# Patient Record
Sex: Male | Born: 1965
Health system: Southern US, Community
[De-identification: ages and names within clinical notes are randomized; demographics above are authoritative.]

## PROBLEM LIST (undated history)

## (undated) DIAGNOSIS — T7840XA Allergy, unspecified, initial encounter: Secondary | ICD-10-CM

## (undated) DIAGNOSIS — E785 Hyperlipidemia, unspecified: Secondary | ICD-10-CM

## (undated) DIAGNOSIS — K5790 Diverticulosis of intestine, part unspecified, without perforation or abscess without bleeding: Secondary | ICD-10-CM

## (undated) HISTORY — PX: TOOTH EXTRACTION: SUR596

## (undated) HISTORY — DX: Diverticulosis of intestine, part unspecified, without perforation or abscess without bleeding: K57.90

## (undated) HISTORY — DX: Hyperlipidemia, unspecified: E78.5

## (undated) HISTORY — PX: WISDOM TOOTH EXTRACTION: SHX21

## (undated) HISTORY — DX: Allergy, unspecified, initial encounter: T78.40XA

---

## 2003-01-24 ENCOUNTER — Encounter: Admission: RE | Admit: 2003-01-24 | Discharge: 2003-01-24 | Payer: Self-pay | Admitting: Family Medicine

## 2003-01-24 ENCOUNTER — Encounter: Payer: Self-pay | Admitting: Family Medicine

## 2004-07-22 ENCOUNTER — Encounter: Admission: RE | Admit: 2004-07-22 | Discharge: 2004-07-22 | Payer: Self-pay | Admitting: Family Medicine

## 2004-08-31 ENCOUNTER — Encounter: Admission: RE | Admit: 2004-08-31 | Discharge: 2004-08-31 | Payer: Self-pay | Admitting: Family Medicine

## 2004-09-02 ENCOUNTER — Encounter: Admission: RE | Admit: 2004-09-02 | Discharge: 2004-09-02 | Payer: Self-pay | Admitting: Family Medicine

## 2014-08-12 LAB — LIPID PANEL
Cholesterol: 193 (ref 0–200)
HDL: 43 (ref 35–70)
Triglycerides: 114 (ref 40–160)

## 2014-08-12 LAB — BASIC METABOLIC PANEL: GLUCOSE: 87

## 2014-12-01 ENCOUNTER — Encounter (HOSPITAL_COMMUNITY): Payer: Self-pay | Admitting: *Deleted

## 2014-12-01 ENCOUNTER — Emergency Department (HOSPITAL_COMMUNITY)
Admission: EM | Admit: 2014-12-01 | Discharge: 2014-12-01 | Disposition: A | Discharge status: Home / Self Care | Payer: 59 | Attending: Emergency Medicine | Admitting: Emergency Medicine

## 2014-12-01 ENCOUNTER — Emergency Department (HOSPITAL_COMMUNITY): Payer: 59

## 2014-12-01 DIAGNOSIS — K088 Other specified disorders of teeth and supporting structures: Secondary | ICD-10-CM | POA: Insufficient documentation

## 2014-12-01 DIAGNOSIS — K122 Cellulitis and abscess of mouth: Secondary | ICD-10-CM

## 2014-12-01 DIAGNOSIS — K0889 Other specified disorders of teeth and supporting structures: Secondary | ICD-10-CM

## 2014-12-01 DIAGNOSIS — R51 Headache: Secondary | ICD-10-CM | POA: Insufficient documentation

## 2014-12-01 DIAGNOSIS — R079 Chest pain, unspecified: Secondary | ICD-10-CM | POA: Insufficient documentation

## 2014-12-01 LAB — CBC WITH DIFFERENTIAL/PLATELET
Basophils Absolute: 0 10*3/uL (ref 0.0–0.1)
Basophils Relative: 0 % (ref 0–1)
Eosinophils Absolute: 0 10*3/uL (ref 0.0–0.7)
Eosinophils Relative: 0 % (ref 0–5)
HEMATOCRIT: 45.8 % (ref 39.0–52.0)
Hemoglobin: 16.3 g/dL (ref 13.0–17.0)
LYMPHS PCT: 8 % — AB (ref 12–46)
Lymphs Abs: 0.6 10*3/uL — ABNORMAL LOW (ref 0.7–4.0)
MCH: 32 pg (ref 26.0–34.0)
MCHC: 35.6 g/dL (ref 30.0–36.0)
MCV: 90 fL (ref 78.0–100.0)
MONO ABS: 0.6 10*3/uL (ref 0.1–1.0)
Monocytes Relative: 8 % (ref 3–12)
Neutro Abs: 6.1 10*3/uL (ref 1.7–7.7)
Neutrophils Relative %: 84 % — ABNORMAL HIGH (ref 43–77)
PLATELETS: 129 10*3/uL — AB (ref 150–400)
RBC: 5.09 MIL/uL (ref 4.22–5.81)
RDW: 12.5 % (ref 11.5–15.5)
WBC: 7.3 10*3/uL (ref 4.0–10.5)

## 2014-12-01 LAB — COMPREHENSIVE METABOLIC PANEL
ALK PHOS: 68 U/L (ref 38–126)
ALT: 28 U/L (ref 17–63)
AST: 24 U/L (ref 15–41)
Albumin: 4.2 g/dL (ref 3.5–5.0)
Anion gap: 9 (ref 5–15)
BILIRUBIN TOTAL: 1.7 mg/dL — AB (ref 0.3–1.2)
BUN: 14 mg/dL (ref 6–20)
CALCIUM: 9.2 mg/dL (ref 8.9–10.3)
CHLORIDE: 106 mmol/L (ref 101–111)
CO2: 24 mmol/L (ref 22–32)
Creatinine, Ser: 1.12 mg/dL (ref 0.61–1.24)
GFR calc non Af Amer: >60 mL/min (ref >60)
Glucose, Bld: 110 mg/dL — ABNORMAL HIGH (ref 65–99)
Potassium: 4 mmol/L (ref 3.5–5.1)
SODIUM: 139 mmol/L (ref 135–145)
TOTAL PROTEIN: 6.9 g/dL (ref 6.5–8.1)

## 2014-12-01 MED ORDER — OXYCODONE-ACETAMINOPHEN 5-325 MG PO TABS: 1.0000 | ORAL_TABLET | Freq: Three times a day (TID) | ORAL | Status: DC | PRN

## 2014-12-01 MED ORDER — ONDANSETRON HCL 4 MG/2ML IJ SOLN
4.0000 mg | Freq: Once | INTRAMUSCULAR | Status: AC
  Administered 2014-12-01: 4 mg via INTRAVENOUS
  Filled 2014-12-01: qty 2

## 2014-12-01 MED ORDER — IOHEXOL 300 MG/ML  SOLN
80.0000 mL | Freq: Once | INTRAMUSCULAR | Status: AC | PRN
  Administered 2014-12-01: 100 mL via INTRAVENOUS

## 2014-12-01 MED ORDER — CLINDAMYCIN HCL 300 MG PO CAPS
450.0000 mg | ORAL_CAPSULE | Freq: Once | ORAL | Status: AC
  Administered 2014-12-01: 450 mg via ORAL
  Filled 2014-12-01: qty 1

## 2014-12-01 MED ORDER — SODIUM CHLORIDE 0.9 % IV BOLUS (SEPSIS)
1000.0000 mL | Freq: Once | INTRAVENOUS | Status: AC
Start: 2014-12-01 — End: 2014-12-01
  Administered 2014-12-01: 1000 mL via INTRAVENOUS

## 2014-12-01 MED ORDER — MORPHINE SULFATE 2 MG/ML IJ SOLN
2.0000 mg | Freq: Once | INTRAMUSCULAR | Status: AC
  Administered 2014-12-01: 2 mg via INTRAVENOUS
  Filled 2014-12-01: qty 1

## 2014-12-01 MED ORDER — CLINDAMYCIN HCL 150 MG PO CAPS: 450.0000 mg | ORAL_CAPSULE | Freq: Three times a day (TID) | ORAL | Status: DC

## 2014-12-01 NOTE — ED Notes (Signed)
PA at bedside.

## 2014-12-01 NOTE — ED Notes (Signed)
Pt states that he has a known cracked tooth. Pt has a scheduled appointment with his dentist but has been running a fever at home and is not having difficulty opening his mouth. Pt was told by his dentist to come in for possible IV antibiotic. Pt has been taking Ibuprophen but no oral antibiotics

## 2014-12-01 NOTE — Discharge Instructions (Signed)
Please call your doctor for a followup appointment within 24-48 hours. When you talk to your doctor please let them know that you were seen in the emergency department and have them acquire all of your records so that they can discuss the findings with you and formulate a treatment plan to fully care for your new and ongoing problems. Please follow-up with her primary care provider Please keep appointment with your dentist on 12/03/2014 Please take anti-biotics as prescribed Please massage and apply warm compressions to the area Please take medications as prescribed - while on pain medications there is to be no drinking alcohol, driving, operating any heavy machinery. If extra please dispose in a proper manner. Please do not take any extra Tylenol with this medication for this can lead to Tylenol overdose and liver issues.  Please continue to monitor symptoms closely and if symptoms are to worsen or change (fever greater than 101, chills, sweating, nausea, vomiting, chest pain, shortness of breathe, difficulty breathing, weakness, numbness, tingling, worsening or changes to pain pattern, tongue swelling, throat closing sensation, difficulty breathing, increased swelling to the face or the neck, changes to skin colored, active bleeding or drainage at the site) please report back to the Emergency Department immediately.    Cellulitis Cellulitis is an infection of the skin and the tissue beneath it. The infected area is usually red and tender. Cellulitis occurs most often in the arms and lower legs.  CAUSES  Cellulitis is caused by bacteria that enter the skin through cracks or cuts in the skin. The most common types of bacteria that cause cellulitis are staphylococci and streptococci. SIGNS AND SYMPTOMS   Redness and warmth.  Swelling.  Tenderness or pain.  Fever. DIAGNOSIS  Your health care provider can usually determine what is wrong based on a physical exam. Blood tests may also be  done. TREATMENT  Treatment usually involves taking an antibiotic medicine. HOME CARE INSTRUCTIONS   Take your antibiotic medicine as directed by your health care provider. Finish the antibiotic even if you start to feel better.  Keep the infected arm or leg elevated to reduce swelling.  Apply a warm cloth to the affected area up to 4 times per day to relieve pain.  Take medicines only as directed by your health care provider.  Keep all follow-up visits as directed by your health care provider. SEEK MEDICAL CARE IF:   You notice red streaks coming from the infected area.  Your red area gets larger or turns dark in color.  Your bone or joint underneath the infected area becomes painful after the skin has healed.  Your infection returns in the same area or another area.  You notice a swollen bump in the infected area.  You develop new symptoms.  You have a fever. SEEK IMMEDIATE MEDICAL CARE IF:   You feel very sleepy.  You develop vomiting or diarrhea.  You have a general ill feeling (malaise) with muscle aches and pains. MAKE SURE YOU:   Understand these instructions.  Will watch your condition.  Will get help right away if you are not doing well or get worse. Document Released: 03/24/2005 Document Revised: 10/29/2013 Document Reviewed: 08/30/2011 [facility] Patient Information 2015 [place], [place]. This information is not intended to replace advice given to you by your health care provider. Make sure you discuss any questions you have with your health care provider.

## 2014-12-01 NOTE — ED Provider Notes (Addendum)
Patient complains of fractured painful tooth occurred 3 weeks ago. Saw his dentist last week. Is scheduled to have crown placed in 2 days. Admits to subjective fever last night with chills. Admits to pain with swallowing. No dyspnea. He reports chest pain left-sided lateral chest 8 separate episodes lasting a split-second each. No shortness of breath no other associated symptoms.  Date: 12/01/2014  Rate: 75  Rhythm: normal sinus rhythm  QRS Axis: normal  Intervals: normal  ST/T Wave abnormalities: normal  Conduction Disutrbances: none  Narrative Interpretation: unremarkable Patient is no distress handling secretions well. Tooth #32 is tender. No swelling around the gingiva. No trismus.    Orlie Dakin, MD 12/01/14 1116 Chest pain highly atypical for acs  Orlie Dakin, MD 12/01/14 [phone]

## 2014-12-01 NOTE — ED Provider Notes (Signed)
CSN: [id number]     Arrival date & time 12/01/14  0907 History   None    Chief Complaint  Patient presents with  . Dental Pain     (Consider location/radiation/quality/duration/timing/severity/associated sxs/prior Treatment) The history is provided by the patient. No language interpreter was used.  Randy Mendez is a 49 y/o M with no known significant PMHx presenting to the ED with dental pain that started 4 days ago with worsening last night. Patient reported that he was seen and assessed by his dentist, Dr. Merrilee Jansky, 5 days ago where he was diagnosed with a cracked tooth and due to get a crown the following week. Patient reported that he has noticed significant swelling and pain over the past couple of days. Reported that last night he was diaphoretic with fever and stated that he was unable to sleep secondary to the pain - reported his fever to be 99.65F. Stated that the pain is localized to the right lower jawline described as a constant pain. Reported that he has been unable to open and close his jawline secondary to the pain. Stated that he also started to experience cramping in his neck and mouth last night. Patient stated that he has been taking Ibuprofen continuously throughout the day. Patient reported that a friend of his is a Pharmacist, community and this individual recommended patient to come to the ED to get IV antibiotics. Patient reported that he is concerned that he will "die from a tooth infection." Reported that while sitting in the waiting room of the ED he started to experience left sided chest pain described as sharp intermittent discomfort without radiation. Stated that he had a headache yesterday - denied thunderclap onset and worst headache of life. Denied bleeding, pus drainage, blurred vision, sudden loss of vision, chest pain, shortness of breath, difficulty breathing, difficulty swallowing, changes to skin color, cough, hemoptysis, throat closing sensation, vomiting, abdominal pain,  hematochezia, melena.  PCP none    History reviewed. No pertinent past medical history. History reviewed. No pertinent past surgical history. No family history on file. History  Substance Use Topics  . Smoking status: Never Smoker   . Smokeless tobacco: Not on file  . Alcohol Use: Not on file    Review of Systems  Constitutional: Positive for fever and fatigue. Negative for chills.  HENT: Positive for dental problem. Negative for trouble swallowing.   Eyes: Negative for visual disturbance.  Respiratory: Negative for cough, chest tightness and shortness of breath.   Cardiovascular: Positive for chest pain.  Gastrointestinal: Positive for nausea. Negative for vomiting and abdominal pain.  Neurological: Positive for headaches. Negative for dizziness and weakness.      Allergies  Review of patient's allergies indicates no known allergies.  Home Medications   Prior to Admission medications   Medication Sig Start Date End Date Taking? Authorizing Provider  ibuprofen (ADVIL,MOTRIN) 200 MG tablet Take 200 mg by mouth every 6 (six) hours as needed for mild pain.   Yes Historical Provider, MD  promethazine (PHENERGAN) 25 MG tablet Take 25 mg by mouth every 6 (six) hours as needed for nausea or vomiting.   Yes Historical Provider, MD  clindamycin (CLEOCIN) 150 MG capsule Take 3 capsules (450 mg total) by mouth 3 (three) times daily. 12/01/14   Nico Syme, PA-C  oxyCODONE-acetaminophen (PERCOCET/ROXICET) 5-325 MG per tablet Take 1 tablet by mouth every 8 (eight) hours as needed for severe pain. 12/01/14   Sorrel Cassetta, PA-C   BP 115/74 mmHg  Pulse  60  Temp(Src) 99.8 F (37.7 C) (Rectal)  Resp 16  SpO2 100% Physical Exam  Constitutional: He is oriented to person, place, and time. He appears well-developed and well-nourished. No distress.  HENT:  Head: Normocephalic and atraumatic.  Mouth/Throat: There is trismus in the jaw.    Facial swelling identified to the right  submandibular region with discomfort upon palpation and negative changes to skin color. Tenderness upon palpation to the right submandibular region. Negative damage noted to teeth-negative diagrammed in decaying noted. Positive trismus-unable to see the posterior aspect of the mouth. Negative tongue swelling. Negative sublingual lesions or tenderness upon palpation to the floor of the mouth.  Eyes: Conjunctivae and EOM are normal. Pupils are equal, round, and reactive to light. Right eye exhibits no discharge. Left eye exhibits no discharge.  Neck: Normal range of motion. Neck supple.  Cardiovascular: Normal rate, regular rhythm and normal heart sounds.  Exam reveals no friction rub.   No murmur heard. Pulses:      Radial pulses are 2+ on the right side, and 2+ on the left side.  Pulmonary/Chest: Effort normal and breath sounds normal. No respiratory distress. He has no wheezes. He has no rales.  Musculoskeletal: Normal range of motion.  Lymphadenopathy:       Head (right side): Submandibular and tonsillar adenopathy present.  Neurological: He is alert and oriented to person, place, and time. No cranial nerve deficit. He exhibits normal muscle tone. Coordination normal. GCS eye subscore is 4. GCS verbal subscore is 5. GCS motor subscore is 6.  Skin: Skin is warm and dry. No rash noted. He is not diaphoretic. No erythema.  Psychiatric: He has a normal mood and affect. His behavior is normal. Thought content normal.  Nursing note and vitals reviewed.   ED Course  Procedures (including critical care time) Labs Review Labs Reviewed  CBC WITH DIFFERENTIAL/PLATELET - Abnormal; Notable for the following:    Platelets 129 (*)    Neutrophils Relative % 84 (*)    Lymphocytes Relative 8 (*)    Lymphs Abs 0.6 (*)    All other components within normal limits  COMPREHENSIVE METABOLIC PANEL - Abnormal; Notable for the following:    Glucose, Bld 110 (*)    Total Bilirubin 1.7 (*)    All other  components within normal limits    Imaging Review Ct Soft Tissue Neck W Contrast  12/01/2014   CLINICAL DATA:  49 year old male with cracked tooth. Right neck pain. Initial encounter.  EXAM: CT NECK WITH CONTRAST  TECHNIQUE: Multidetector CT imaging of the neck was performed using the standard protocol following the bolus administration of intravenous contrast.  CONTRAST:  138mL OMNIPAQUE IOHEXOL 300 MG/ML  SOLN  COMPARISON:  None.  FINDINGS: Pharynx and larynx: No primary worrisome abnormality.  Salivary glands: Asymmetric positioning of submandibular glands with right lower than the left. Minimal prominence proximal right submandibular ducts without stone identified. Surrounding infiltration of fat planes. Please see below.  Thyroid: Negative.  Lymph nodes: Increased number of lymph nodes right neck.  Vascular: No septic thrombophlebitis detected.  Limited intracranial: Negative.  Visualized orbits: Negative.  Mastoids and visualized paranasal sinuses: Clear.  Skeleton: Cervical spondylotic changes with various degrees of spinal stenosis and foraminal narrowing C5-6 and C6-7.  Upper chest: Negative.  Haziness of fat planes greatest surrounding the right submandibular gland with poor definition of fat within the lower right parapharyngeal space suggestive of cellulitis without deep drainable abscess. This may originate from the patient's right lower  posterior molar which is fractured per history. Although the right submandibular gland ducts appear slightly prominent no calcified submandibular duct stone identified as a source of inflammation. Surrounding increased number of right-sided lymph nodes most likely reactive in origin.  IMPRESSION: Haziness of fat planes greatest surrounding the right submandibular gland with poor definition of fat within the lower right parapharyngeal space suggestive of cellulitis currently without deep drainable abscess. This may originate from the patient's right lower posterior  molar which is fractured per history.  Although the right submandibular gland ducts appear slightly prominent no calcified submandibular duct stone identified as a source of inflammation.  Surrounding increased number of right-sided lymph nodes most likely reactive in origin.   Electronically Signed   By: Genia Del M.D.   On: 12/01/2014 12:46     EKG Interpretation None       11:13 AM Dr. Winfred Leeds at bedside assessing patient. Reported that Troponin is not needed. EKG unremarkable.   1:46 PM This provider re-assessed patient. Patient reported that he is feeling well. Discussed labs and imaging in great detail. Discussed plan for discharge with patient who is in accordance to plan of care. Patient breathing well in no sign of distress.   MDM   Final diagnoses:  Pain, dental  Cellulitis of mouth    Medications  clindamycin (CLEOCIN) capsule 450 mg (not administered)  sodium chloride 0.9 % bolus 1,000 mL (0 mLs Intravenous Stopped 12/01/14 1200)  morphine 2 MG/ML injection 2 mg (2 mg Intravenous Given 12/01/14 1103)  ondansetron (ZOFRAN) injection 4 mg (4 mg Intravenous Given 12/01/14 1102)  iohexol (OMNIPAQUE) 300 MG/ML solution 80 mL (100 mLs Intravenous Contrast Given 12/01/14 1128)    Filed Vitals:   12/01/14 1200 12/01/14 1245 12/01/14 1315 12/01/14 1353  BP: 122/74 107/66 109/64 115/74  Pulse: 62 63 66 60  Temp:      TempSrc:      Resp:   24 16  SpO2: 97% 95% 95% 100%   Patient presenting to the ED with dental pain that started approximate 4 days ago after being seen by his dentist and diagnosed with a diagrammed tooth that was getting a crown placed this week. Patient reports that the pain worsened last night with associated fever and chills. Patient reported increased swelling and cramping to the neck into the jawline-reported that he is unable to open and close his jaw. Patient was not placed on antibiotics.  Rectal temperature 99.13F. EKG noted normal sinus rhythm with a  heart rate of 75 bpm. CBC negative elevated leukocytosis. Hemoglobin 16.3, hematocrit 45.8. CMP unremarkable. CT soft tissue of neck with contrast noted haziness of the fat planes suggesting cellulitis without deep drainable abscess noted to the right second mandibular gland. No stone identified in the submandibular duct surrounding increased number of right-sided lymph nodes are most likely reactive in origin. CT soft tissue neck with contrast identified cellulitis in the right submandibular region without drainable abscess-associated with fractured tooth noted on examination. Negative findings of peritonsillar abscess. Negative findings of Ludwig's angina. Negative findings of retroperitoneal abscess. Patient given IV fluids, pain medications and anti-biotics in ED setting. Patient stable, afebrile. Patient not septic appearing. Airway intact, negative signs of respiratory distress. Discharged patient. Discharge patient with anti-biotics and pain medications-discussed course, cautions, disposal technique. Referred patient to primary care provider and dentist. Discussed with patient to keep appointment on Tuesday, 12/03/2014 with dentist. Discussed with patient to rest and stay hydrated. Discussed with patient to apply warm compressions. Discussed  with patient to closely monitor symptoms and if symptoms are to worsen or change to report back to the ED - strict return instructions given.  Patient agreed to plan of care, understood, all questions answered.   Jamse Mead, PA-C 12/01/14 1421  Orlie Dakin, MD 12/01/14 [phone]

## 2015-09-24 LAB — LIPID PANEL
CHOLESTEROL: 203 — AB (ref 0–200)
HDL: 40 (ref 35–70)
TRIGLYCERIDES: 174 — AB (ref 40–160)

## 2015-09-26 LAB — BASIC METABOLIC PANEL: GLUCOSE: 82

## 2015-09-26 LAB — PSA: PSA: 0.86

## 2017-07-11 ENCOUNTER — Encounter: Payer: Self-pay | Admitting: Family Medicine

## 2017-07-11 ENCOUNTER — Encounter: Payer: Self-pay | Admitting: Gastroenterology

## 2017-07-11 ENCOUNTER — Ambulatory Visit (INDEPENDENT_AMBULATORY_CARE_PROVIDER_SITE_OTHER): Payer: 59 | Admitting: Family Medicine

## 2017-07-11 VITALS — BP 102/70 | HR 70 | Temp 98.6°F | Ht 68.75 in | Wt 183.7 lb

## 2017-07-11 DIAGNOSIS — R1032 Left lower quadrant pain: Secondary | ICD-10-CM | POA: Diagnosis not present

## 2017-07-11 DIAGNOSIS — G43909 Migraine, unspecified, not intractable, without status migrainosus: Secondary | ICD-10-CM | POA: Insufficient documentation

## 2017-07-11 DIAGNOSIS — Z1211 Encounter for screening for malignant neoplasm of colon: Secondary | ICD-10-CM

## 2017-07-11 DIAGNOSIS — Z7689 Persons encountering health services in other specified circumstances: Secondary | ICD-10-CM

## 2017-07-11 DIAGNOSIS — J302 Other seasonal allergic rhinitis: Secondary | ICD-10-CM | POA: Insufficient documentation

## 2017-07-11 LAB — CBC WITH DIFFERENTIAL/PLATELET
Basophils Absolute: 0 10*3/uL (ref 0.0–0.1)
Basophils Relative: 0.5 % (ref 0.0–3.0)
EOS PCT: 0.6 % (ref 0.0–5.0)
Eosinophils Absolute: 0.1 10*3/uL (ref 0.0–0.7)
HCT: 45.1 % (ref 39.0–52.0)
HEMOGLOBIN: 15.4 g/dL (ref 13.0–17.0)
LYMPHS PCT: 20.7 % (ref 12.0–46.0)
Lymphs Abs: 1.8 10*3/uL (ref 0.7–4.0)
MCHC: 34.2 g/dL (ref 30.0–36.0)
MCV: 94.1 fl (ref 78.0–100.0)
MONO ABS: 0.5 10*3/uL (ref 0.1–1.0)
MONOS PCT: 5.5 % (ref 3.0–12.0)
Neutro Abs: 6.4 10*3/uL (ref 1.4–7.7)
Neutrophils Relative %: 72.7 % (ref 43.0–77.0)
Platelets: 204 10*3/uL (ref 150.0–400.0)
RBC: 4.79 Mil/uL (ref 4.22–5.81)
RDW: 13.4 % (ref 11.5–15.5)
WBC: 8.8 10*3/uL (ref 4.0–10.5)

## 2017-07-11 LAB — COMPREHENSIVE METABOLIC PANEL
ALBUMIN: 4.7 g/dL (ref 3.5–5.2)
ALK PHOS: 60 U/L (ref 39–117)
ALT: 18 U/L (ref 0–53)
AST: 17 U/L (ref 0–37)
BUN: 12 mg/dL (ref 6–23)
CO2: 33 mEq/L — ABNORMAL HIGH (ref 19–32)
Calcium: 9.2 mg/dL (ref 8.4–10.5)
Chloride: 100 mEq/L (ref 96–112)
Creatinine, Ser: 1.1 mg/dL (ref 0.40–1.50)
GFR: 74.96 mL/min (ref >60.00)
Glucose, Bld: 72 mg/dL (ref 70–99)
POTASSIUM: 3.8 meq/L (ref 3.5–5.1)
Sodium: 138 mEq/L (ref 135–145)
TOTAL PROTEIN: 6.9 g/dL (ref 6.0–8.3)
Total Bilirubin: 0.9 mg/dL (ref 0.2–1.2)

## 2017-07-11 NOTE — Patient Instructions (Addendum)
Abdominal Pain, Adult Many things can cause belly (abdominal) pain. Most times, belly pain is not dangerous. Many cases of belly pain can be watched and treated at home. Sometimes belly pain is serious, though. Your doctor will try to find the cause of your belly pain. Follow these instructions at home:  Take over-the-counter and prescription medicines only as told by your doctor. Do not take medicines that help you poop (laxatives) unless told to by your doctor.  Drink enough fluid to keep your pee (urine) clear or pale yellow.  Watch your belly pain for any changes.  Keep all follow-up visits as told by your doctor. This is important. Contact a doctor if:  Your belly pain changes or gets worse.  You are not hungry, or you lose weight without trying.  You are having trouble pooping (constipated) or have watery poop (diarrhea) for more than 2-3 days.  You have pain when you pee or poop.  Your belly pain wakes you up at night.  Your pain gets worse with meals, after eating, or with certain foods.  You are throwing up and cannot keep anything down.  You have a fever. Get help right away if:  Your pain does not go away as soon as your doctor says it should.  You cannot stop throwing up.  Your pain is only in areas of your belly, such as the right side or the left lower part of the belly.  You have bloody or black poop, or poop that looks like tar.  You have very bad pain, cramping, or bloating in your belly.  You have signs of not having enough fluid or water in your body (dehydration), such as: ? Dark pee, very little pee, or no pee. ? Cracked lips. ? Dry mouth. ? Sunken eyes. ? Sleepiness. ? Weakness. This information is not intended to replace advice given to you by your health care provider. Make sure you discuss any questions you have with your health care provider. Document Released: 12/01/2007 Document Revised: 01/02/2016 Document Reviewed: 11/26/2015 Elsevier  Interactive Patient Education  2018 [employer].  Colonoscopy, Adult A colonoscopy is an exam to look at the large intestine. It is done to check for problems, such as:  Lumps (tumors).  Growths (polyps).  Swelling (inflammation).  Bleeding.  What happens before the procedure? Eating and drinking Follow instructions from your doctor about eating and drinking. These instructions may include:  A few days before the procedure - follow a low-fiber diet. ? Avoid nuts. ? Avoid seeds. ? Avoid dried fruit. ? Avoid raw fruits. ? Avoid vegetables.  1-3 days before the procedure - follow a clear liquid diet. Avoid liquids that have red or purple dye. Drink only clear liquids, such as: ? Clear broth or bouillon. ? Black coffee or tea. ? Clear juice. ? Clear soft drinks or sports drinks. ? Gelatin dessert. ? Popsicles.  On the day of the procedure - do not eat or drink anything during the 2 hours before the procedure.  Bowel prep If you were prescribed an oral bowel prep:  Take it as told by your doctor. Starting the day before your procedure, you will need to drink a lot of liquid. The liquid will cause you to poop (have bowel movements) until your poop is almost clear or light green.  If your skin or butt gets irritated from diarrhea, you may: ? Wipe the area with wipes that have medicine in them, such as adult wet wipes with aloe and  vitamin E. ? Put something on your skin that soothes the area, such as petroleum jelly.  If you throw up (vomit) while drinking the bowel prep, take a break for up to 60 minutes. Then begin the bowel prep again. If you keep throwing up and you cannot take the bowel prep without throwing up, call your doctor.  General instructions  Ask your doctor about changing or stopping your normal medicines. This is important if you take diabetes medicines or blood thinners.  Plan to have someone take you home from the hospital or clinic. What happens during  the procedure?  An IV tube may be put into one of your veins.  You will be given medicine to help you relax (sedative).  To reduce your risk of infection: ? Your doctors will wash their hands. ? Your anal area will be washed with soap.  You will be asked to lie on your side with your knees bent.  Your doctor will get a long, thin, flexible tube ready. The tube will have a camera and a light on the end.  The tube will be put into your anus.  The tube will be gently put into your large intestine.  Air will be delivered into your large intestine to keep it open. You may feel some pressure or cramping.  The camera will be used to take photos.  A small tissue sample may be removed from your body to be looked at under a microscope (biopsy). If any possible problems are found, the tissue will be sent to a lab for testing.  If small growths are found, your doctor may remove them and have them checked for cancer.  The tube that was put into your anus will be slowly removed. The procedure may vary among doctors and hospitals. What happens after the procedure?  Your doctor will check on you often until the medicines you were given have worn off.  Do not drive for 24 hours after the procedure.  You may have a small amount of blood in your poop.  You may pass gas.  You may have mild cramps or bloating in your belly (abdomen).  It is up to you to get the results of your procedure. Ask your doctor, or the department performing the procedure, when your results will be ready. This information is not intended to replace advice given to you by your health care provider. Make sure you discuss any questions you have with your health care provider. Document Released: 07/17/2010 Document Revised: 04/14/2016 Document Reviewed: 08/26/2015 Elsevier Interactive Patient Education  2017 [employer].

## 2017-07-11 NOTE — Progress Notes (Signed)
Patient presents to clinic today to establish care.  SUBJECTIVE: PMH:  Pt is a 52 yo with pmh sig for seasonal allergies and migraines.  Pt was formerly seen at [facility], but decided to leave over billing issues and never being able to see the same provider.  L Groin pain, acute: -ongoing x several weeks -Pt initially thought he pulled a muscle, but the pain has become increasingly worse. -pt may take ibuprofen  -denies constipation, diarrhea, blood in stools.  Allergies: NKDA  P Sug Hx: Oral surgery  Social Hx: Pt is married.  He and his wife have 2 kids.  Pt is currently employed by [employer].  He denies tobacco or drug use.  Pt endorses rare EtOH use.  Family Hx: Mom-alive, hearing loss, HLD, mental illness Dad-alive, arthritis, hearing loss, HLD Sister-Leesa, alive Sister-Susie, deceased, alcohol abuse, early death, mental illness Sister-Tori, alive, miscarriage Daughter-alive Son-alive MGM-deceased MGF-deceased PGM-deceased PGF-deceased  Health Maintenance: Dental --Augustina Mood Last physical 2017   History reviewed. No pertinent past medical history.  History reviewed. No pertinent surgical history.  Current Outpatient Medications on File Prior to Visit  Medication Sig Dispense Refill  . ibuprofen (ADVIL,MOTRIN) 200 MG tablet Take 200 mg by mouth every 6 (six) hours as needed for mild pain.    . promethazine (PHENERGAN) 25 MG tablet Take 25 mg by mouth every 6 (six) hours as needed for nausea or vomiting.     No current facility-administered medications on file prior to visit.     No Known Allergies  History reviewed. No pertinent family history.  Social History   Socioeconomic History  . Marital status: Married    Spouse name: Not on file  . Number of children: Not on file  . Years of education: Not on file  . Highest education level: Not on file  Social Needs  . Financial resource strain: Not on file  . Food insecurity  - worry: Not on file  . Food insecurity - inability: Not on file  . Transportation needs - medical: Not on file  . Transportation needs - non-medical: Not on file  Occupational History  . Not on file  Tobacco Use  . Smoking status: Never Smoker  . Smokeless tobacco: Never Used  Substance and Sexual Activity  . Alcohol use: Yes  . Drug use: No  . Sexual activity: Not on file  Other Topics Concern  . Not on file  Social History Narrative  . Not on file    ROS General: Denies fever, chills, night sweats, changes in weight, changes in appetite HEENT: Denies headaches, ear pain, changes in vision, rhinorrhea, sore throat CV: Denies CP, palpitations, SOB, orthopnea Pulm: Denies SOB, cough, wheezing GI: Denies abdominal pain, nausea, vomiting, diarrhea, constipation GU: Denies dysuria, hematuria, frequency, vaginal discharge Msk: Denies muscle cramps, joint pains  +L groin pain Neuro: Denies weakness, numbness, tingling Skin: Denies rashes, bruising Psych: Denies depression, anxiety, hallucinations  BP 102/70 (BP Location: Right Arm, Patient Position: Sitting, Cuff Size: Normal)   Pulse 70   Temp 98.6 F (37 C) (Oral)   Ht 5' 8.75" (1.746 m)   Wt 183 lb 11.2 oz (83.3 kg)   BMI 27.33 kg/m   Physical Exam Gen. Pleasant, well developed, well-nourished, in NAD HEENT - [place]/AT, PERRL, EOMI, conjunctive clear, no scleral icterus, no nasal drainage, pharynx without erythema or exudate. Lungs: no use of accessory muscles, CTAB, no wheezes, rales or rhonchi Cardiovascular: RRR, No r/g/m, no peripheral  edema Abdomen: BS present, soft,nondistended, no hepatosplenomegaly.  TTP in LLQ/L groin.   Neuro:  A&Ox3, CN II-XII intact, normal gait Psych: normal affect, mood appropriate GU: chaperone present.  Normal male genitalia, No inguinal hernia appreciated on exam.  Recent Results (from the past 2160 hour(s))  Comprehensive metabolic panel     Status: Abnormal   Collection Time: 07/11/17   2:18 PM  Result Value Ref Range   Sodium 138 135 - 145 mEq/L   Potassium 3.8 3.5 - 5.1 mEq/L   Chloride 100 96 - 112 mEq/L   CO2 33 (H) 19 - 32 mEq/L   Glucose, Bld 72 70 - 99 mg/dL   BUN 12 6 - 23 mg/dL   Creatinine, Ser 1.10 0.40 - 1.50 mg/dL   Total Bilirubin 0.9 0.2 - 1.2 mg/dL   Alkaline Phosphatase 60 39 - 117 U/L   AST 17 0 - 37 U/L   ALT 18 0 - 53 U/L   Total Protein 6.9 6.0 - 8.3 g/dL   Albumin 4.7 3.5 - 5.2 g/dL   Calcium 9.2 8.4 - 10.5 mg/dL   GFR 74.96 >60.00 mL/min  CBC with Differential/Platelet     Status: None   Collection Time: 07/11/17  2:18 PM  Result Value Ref Range   WBC 8.8 4.0 - 10.5 K/uL   RBC 4.79 4.22 - 5.81 Mil/uL   Hemoglobin 15.4 13.0 - 17.0 g/dL   HCT 45.1 39.0 - 52.0 %   MCV 94.1 78.0 - 100.0 fl   MCHC 34.2 30.0 - 36.0 g/dL   RDW 13.4 11.5 - 15.5 %   Platelets 204.0 150.0 - 400.0 K/uL   Neutrophils Relative % 72.7 43.0 - 77.0 %   Lymphocytes Relative 20.7 12.0 - 46.0 %   Monocytes Relative 5.5 3.0 - 12.0 %   Eosinophils Relative 0.6 0.0 - 5.0 %   Basophils Relative 0.5 0.0 - 3.0 %   Neutro Abs 6.4 1.4 - 7.7 K/uL   Lymphs Abs 1.8 0.7 - 4.0 K/uL   Monocytes Absolute 0.5 0.1 - 1.0 K/uL   Eosinophils Absolute 0.1 0.0 - 0.7 K/uL   Basophils Absolute 0.0 0.0 - 0.1 K/uL    Assessment/Plan: Left lower quadrant pain  -discussed possible causes including inguinal hernia (though not appreciated on exam), colitis, MSK strain, etc. -Given increasing pain will proceed with CT - Plan: CT ABDOMEN PELVIS W WO CONTRAST, Comprehensive metabolic panel, CBC with Differential/Platelet  Screen for colon cancer  - Plan: Ambulatory referral to Gastroenterology  Encounter to establish care -We reviewed the PMH, PSH, FH, SH, Meds and Allergies. -We provided refills for any medications we will prescribe as needed. -We addressed current concerns per orders and patient instructions. -We have asked for records for pertinent exams, studies, vaccines and notes from  previous providers. -We have advised patient to follow up per instructions below.   F/u in the next few wks., sooner if needed.  Pt to schedule CPE as well.   Grier Mitts, MD

## 2017-07-12 ENCOUNTER — Other Ambulatory Visit: Payer: Self-pay | Admitting: Family Medicine

## 2017-07-12 DIAGNOSIS — R1032 Left lower quadrant pain: Secondary | ICD-10-CM

## 2017-07-12 DIAGNOSIS — R109 Unspecified abdominal pain: Secondary | ICD-10-CM

## 2017-07-25 ENCOUNTER — Ambulatory Visit (INDEPENDENT_AMBULATORY_CARE_PROVIDER_SITE_OTHER)
Admission: RE | Admit: 2017-07-25 | Discharge: 2017-07-25 | Disposition: A | Discharge status: Home / Self Care | Payer: 59 | Source: Ambulatory Visit | Attending: Family Medicine | Admitting: Family Medicine

## 2017-07-25 DIAGNOSIS — R1032 Left lower quadrant pain: Secondary | ICD-10-CM

## 2017-07-25 DIAGNOSIS — R109 Unspecified abdominal pain: Secondary | ICD-10-CM

## 2017-07-25 DIAGNOSIS — K5790 Diverticulosis of intestine, part unspecified, without perforation or abscess without bleeding: Secondary | ICD-10-CM | POA: Diagnosis not present

## 2017-07-25 MED ORDER — IOPAMIDOL (ISOVUE-300) INJECTION 61%
100.0000 mL | Freq: Once | INTRAVENOUS | Status: AC | PRN
  Administered 2017-07-25: 100 mL via INTRAVENOUS

## 2017-07-26 ENCOUNTER — Encounter: Payer: Self-pay | Admitting: Family Medicine

## 2017-07-27 ENCOUNTER — Ambulatory Visit (INDEPENDENT_AMBULATORY_CARE_PROVIDER_SITE_OTHER): Payer: 59 | Admitting: Family Medicine

## 2017-07-27 ENCOUNTER — Encounter: Payer: Self-pay | Admitting: Family Medicine

## 2017-07-27 VITALS — BP 110/80 | HR 78 | Temp 98.3°F | Wt 177.8 lb

## 2017-07-27 DIAGNOSIS — Z125 Encounter for screening for malignant neoplasm of prostate: Secondary | ICD-10-CM | POA: Diagnosis not present

## 2017-07-27 DIAGNOSIS — K5790 Diverticulosis of intestine, part unspecified, without perforation or abscess without bleeding: Secondary | ICD-10-CM

## 2017-07-27 DIAGNOSIS — Z131 Encounter for screening for diabetes mellitus: Secondary | ICD-10-CM

## 2017-07-27 DIAGNOSIS — K760 Fatty (change of) liver, not elsewhere classified: Secondary | ICD-10-CM | POA: Diagnosis not present

## 2017-07-27 DIAGNOSIS — L570 Actinic keratosis: Secondary | ICD-10-CM | POA: Diagnosis not present

## 2017-07-27 DIAGNOSIS — Z13220 Encounter for screening for lipoid disorders: Secondary | ICD-10-CM

## 2017-07-27 DIAGNOSIS — Z0000 Encounter for general adult medical examination without abnormal findings: Secondary | ICD-10-CM

## 2017-07-27 LAB — PSA: PSA: 1.91 ng/mL (ref 0.10–4.00)

## 2017-07-27 LAB — LIPID PANEL
CHOL/HDL RATIO: 6
Cholesterol: 239 mg/dL — ABNORMAL HIGH (ref 0–200)
HDL: 41 mg/dL (ref >39.00)
LDL Cholesterol: 179 mg/dL — ABNORMAL HIGH (ref 0–99)
NONHDL: 198.09
TRIGLYCERIDES: 93 mg/dL (ref 0.0–149.0)
VLDL: 18.6 mg/dL (ref 0.0–40.0)

## 2017-07-27 LAB — HEMOGLOBIN A1C: HEMOGLOBIN A1C: 5.7 % (ref 4.6–6.5)

## 2017-07-27 NOTE — Progress Notes (Signed)
Subjective:     Randy Mendez is a 52 y.o. male and is here for a comprehensive physical exam. The patient reports no problems.  Pt states he is doing well and is trying to eat better.  Pt endorses increased stress at work b/c he has a new boss.  Pt denies tobacco, alcohol, or drug use.  Social History   Socioeconomic History  . Marital status: Married    Spouse name: Not on file  . Number of children: Not on file  . Years of education: Not on file  . Highest education level: Not on file  Social Needs  . Financial resource strain: Not on file  . Food insecurity - worry: Not on file  . Food insecurity - inability: Not on file  . Transportation needs - medical: Not on file  . Transportation needs - non-medical: Not on file  Occupational History  . Not on file  Tobacco Use  . Smoking status: Never Smoker  . Smokeless tobacco: Never Used  Substance and Sexual Activity  . Alcohol use: Yes  . Drug use: No  . Sexual activity: Not on file  Other Topics Concern  . Not on file  Social History Narrative  . Not on file   Health Maintenance  Topic Date Due  . HIV Screening  05/12/1981  . TETANUS/TDAP  05/12/1985  . COLONOSCOPY  05/12/2016  . INFLUENZA VACCINE  01/26/2017    The following portions of the patient's history were reviewed and updated as appropriate: allergies, current medications, past family history, past medical history, past social history, past surgical history and problem list.  Review of Systems A comprehensive review of systems was negative.   Objective:    BP 110/80 (BP Location: Left Arm, Patient Position: Sitting, Cuff Size: Normal)   Pulse 78   Temp 98.3 F (36.8 C) (Oral)   Wt 177 lb 12.8 oz (80.6 kg)   BMI 26.45 kg/m  General appearance: alert, cooperative and appears stated age Head: Normocephalic, without obvious abnormality, atraumatic Eyes: conjunctivae/corneas clear. PERRL, EOM's intact. Fundi benign. Ears: normal TM's and external  ear canals both ears Nose: Nares normal. Septum midline. Mucosa normal. No drainage or sinus tenderness. Throat: lips, mucosa, and tongue normal; teeth and gums normal Lungs: clear to auscultation bilaterally Heart: regular rate and rhythm, S1, S2 normal, no murmur, click, rub or gallop Abdomen: soft, non-tender; bowel sounds normal; no masses,  no organomegaly Extremities: extremities normal, atraumatic, no cyanosis or edema Pulses: 2+ and symmetric Skin: Skin color, texture, turgor normal. No rashes or lesions or 2 areas of actinic keratosis noted on dorsum R hand and R shoulder. Lymph nodes: Cervical, supraclavicular, and axillary nodes normal.  Neurologic: Alert and oriented X 3, normal strength and tone. Normal symmetric reflexes. Normal coordination and gait    Assessment:    Healthy male exam with recent finding of diverticulosis and mild infiltration of fat in liver per recent CT abd/pelvis/     Plan:      Well Exam: -Anticipatory guidance given including wearing seatbelts, increasing p.o. intake of water, increasing physical activity, eating better. -Patient interested in flu shot but unsure if he had one.  Attempting to call his employer to find out. -We will obtain labs such as lipid panel and PSA this visit. -Patient has an upcoming appointment in February for colonoscopy. -Given handout on healthcare maintenance, fatty liver, diverticulosis. See After Visit Summary for Counseling Recommendations   Diverticulosis -noted on CT abd/pelvis -given handout, discussed  results -pt has upcoming colonoscopy.  Mild fat infiltration of liver -noted on CT abd/Pelvis -discussed possible causes -given handout  Actinic Keratosis -2 small areas noted on dorsum of R hand and R shoulder. -Pt plans to have them removed.  Interested in seeing Derm later this yr. -Discussed wearing sunscreen while outdoors.  Follow-up in 1 year for CPE, sooner if needed.  Grier Mitts, MD

## 2017-07-27 NOTE — Patient Instructions (Addendum)
Preventive Care 40-64 Years, Male Preventive care refers to lifestyle choices and visits with your health care provider that can promote health and wellness. What does preventive care include?  A yearly physical exam. This is also called an annual well check.  Dental exams once or twice a year.  Routine eye exams. Ask your health care provider how often you should have your eyes checked.  Personal lifestyle choices, including: ? Daily care of your teeth and gums. ? Regular physical activity. ? Eating a healthy diet. ? Avoiding tobacco and drug use. ? Limiting alcohol use. ? Practicing safe sex. ? Taking low-dose aspirin every day starting at age 39. What happens during an annual well check? The services and screenings done by your health care provider during your annual well check will depend on your age, overall health, lifestyle risk factors, and family history of disease. Counseling Your health care provider may ask you questions about your:  Alcohol use.  Tobacco use.  Drug use.  Emotional well-being.  Home and relationship well-being.  Sexual activity.  Eating habits.  Work and work Statistician.  Screening You may have the following tests or measurements:  Height, weight, and BMI.  Blood pressure.  Lipid and cholesterol levels. These may be checked every 5 years, or more frequently if you are over 76 years old.  Skin check.  Lung cancer screening. You may have this screening every year starting at age 19 if you have a 30-pack-year history of smoking and currently smoke or have quit within the past 15 years.  Fecal occult blood test (FOBT) of the stool. You may have this test every year starting at age 26.  Flexible sigmoidoscopy or colonoscopy. You may have a sigmoidoscopy every 5 years or a colonoscopy every 10 years starting at age 17.  Prostate cancer screening. Recommendations will vary depending on your family history and other risks.  Hepatitis C  blood test.  Hepatitis B blood test.  Sexually transmitted disease (STD) testing.  Diabetes screening. This is done by checking your blood sugar (glucose) after you have not eaten for a while (fasting). You may have this done every 1-3 years.  Discuss your test results, treatment options, and if necessary, the need for more tests with your health care provider. Vaccines Your health care provider may recommend certain vaccines, such as:  Influenza vaccine. This is recommended every year.  Tetanus, diphtheria, and acellular pertussis (Tdap, Td) vaccine. You may need a Td booster every 10 years.  Varicella vaccine. You may need this if you have not been vaccinated.  Zoster vaccine. You may need this after age 79.  Measles, mumps, and rubella (MMR) vaccine. You may need at least one dose of MMR if you were born in 1957 or later. You may also need a second dose.  Pneumococcal 13-valent conjugate (PCV13) vaccine. You may need this if you have certain conditions and have not been vaccinated.  Pneumococcal polysaccharide (PPSV23) vaccine. You may need one or two doses if you smoke cigarettes or if you have certain conditions.  Meningococcal vaccine. You may need this if you have certain conditions.  Hepatitis A vaccine. You may need this if you have certain conditions or if you travel or work in places where you may be exposed to hepatitis A.  Hepatitis B vaccine. You may need this if you have certain conditions or if you travel or work in places where you may be exposed to hepatitis B.  Haemophilus influenzae type b (Hib) vaccine.  You may need this if you have certain risk factors.  Talk to your health care provider about which screenings and vaccines you need and how often you need them. This information is not intended to replace advice given to you by your health care provider. Make sure you discuss any questions you have with your health care provider. Document Released: 07/11/2015  Document Revised: 03/03/2016 Document Reviewed: 04/15/2015 Elsevier Interactive Patient Education  2018 [employer].  Diverticulosis Diverticulosis is a condition that develops when small pouches (diverticula) form in the wall of the large intestine (colon). The colon is where water is absorbed and stool is formed. The pouches form when the inside layer of the colon pushes through weak spots in the outer layers of the colon. You may have a few pouches or many of them. What are the causes? The cause of this condition is not known. What increases the risk? The following factors may make you more likely to develop this condition:  Being older than age 67. Your risk for this condition increases with age. Diverticulosis is rare among people younger than age 39. By age 43, many people have it.  Eating a low-fiber diet.  Having frequent constipation.  Being overweight.  Not getting enough exercise.  Smoking.  Taking over-the-counter pain medicines, like aspirin and ibuprofen.  Having a family history of diverticulosis.  What are the signs or symptoms? In most people, there are no symptoms of this condition. If you do have symptoms, they may include:  Bloating.  Cramps in the abdomen.  Constipation or diarrhea.  Pain in the lower left side of the abdomen.  How is this diagnosed? This condition is most often diagnosed during an exam for other colon problems. Because diverticulosis usually has no symptoms, it often cannot be diagnosed independently. This condition may be diagnosed by:  Using a flexible scope to examine the colon (colonoscopy).  Taking an X-ray of the colon after dye has been put into the colon (barium enema).  Doing a CT scan.  How is this treated? You may not need treatment for this condition if you have never developed an infection related to diverticulosis. If you have had an infection before, treatment may include:  Eating a high-fiber diet. This may  include eating more fruits, vegetables, and grains.  Taking a fiber supplement.  Taking a live bacteria supplement (probiotic).  Taking medicine to relax your colon.  Taking antibiotic medicines.  Follow these instructions at home:  Drink 6-8 glasses of water or more each day to prevent constipation.  Try not to strain when you have a bowel movement.  If you have had an infection before: ? Eat more fiber as directed by your health care provider or your diet and nutrition specialist (dietitian). ? Take a fiber supplement or probiotic, if your health care provider approves.  Take over-the-counter and prescription medicines only as told by your health care provider.  If you were prescribed an antibiotic, take it as told by your health care provider. Do not stop taking the antibiotic even if you start to feel better.  Keep all follow-up visits as told by your health care provider. This is important. Contact a health care provider if:  You have pain in your abdomen.  You have bloating.  You have cramps.  You have not had a bowel movement in 3 days. Get help right away if:  Your pain gets worse.  Your bloating becomes very bad.  You have a fever or chills,  and your symptoms suddenly get worse.  You vomit.  You have bowel movements that are bloody or black.  You have bleeding from your rectum. Summary  Diverticulosis is a condition that develops when small pouches (diverticula) form in the wall of the large intestine (colon).  You may have a few pouches or many of them.  This condition is most often diagnosed during an exam for other colon problems.  If you have had an infection related to diverticulosis, treatment may include increasing the fiber in your diet, taking supplements, or taking medicines. This information is not intended to replace advice given to you by your health care provider. Make sure you discuss any questions you have with your health care  provider. Document Released: 03/11/2004 Document Revised: 05/03/2016 Document Reviewed: 05/03/2016 Elsevier Interactive Patient Education  2017 [employer] Inc[employer]  Fatty Liver Fatty liver, also called hepatic steatosis or steatohepatitis, is a condition in which too much fat has built up in your liver cells. The liver removes harmful substances from your bloodstream. It produces fluids your body needs. It also helps your body use and store energy from the food you eat. In many cases, fatty liver does not cause symptoms or problems. It is often diagnosed when tests are being done for other reasons. However, over time, fatty liver can cause inflammation that may lead to more serious liver problems, such as scarring of the liver (cirrhosis). What are the causes? Causes of fatty liver may include:  Drinking too much alcohol.  Poor nutrition.  Obesity.  Cushing syndrome.  Diabetes.  Hyperlipidemia.  Pregnancy.  Certain drugs.  Poisons.  Some viral infections.  What increases the risk? You may be more likely to develop fatty liver if you:  Abuse alcohol.  Are pregnant.  Are overweight.  Have diabetes.  Have hepatitis.  Have a high triglyceride level.  What are the signs or symptoms? Fatty liver often does not cause any symptoms. In cases where symptoms develop, they can include:  Fatigue.  Weakness.  Weight loss.  Confusion.  Abdominal pain.  Yellowing of your skin and the white parts of your eyes (jaundice).  Nausea and vomiting.  How is this diagnosed? Fatty liver may be diagnosed by:  Physical exam and medical history.  Blood tests.  Imaging tests, such as an ultrasound, CT scan, or MRI.  Liver biopsy. A small sample of liver tissue is removed using a needle. The sample is then looked at under a microscope.  How is this treated? Fatty liver is often caused by other health conditions. Treatment for fatty liver may involve medicines and lifestyle  changes to manage conditions such as:  Alcoholism.  High cholesterol.  Diabetes.  Being overweight or obese.  Follow these instructions at home:  Eat a healthy diet as directed by your health care provider.  Exercise regularly. This can help you lose weight and control your cholesterol and diabetes. Talk to your health care provider about an exercise plan and which activities are best for you.  Do not drink alcohol.  Take medicines only as directed by your health care provider. Contact a health care provider if: You have difficulty controlling your:  Blood sugar.  Cholesterol.  Alcohol consumption.  Get help right away if:  You have abdominal pain.  You have jaundice.  You have nausea and vomiting. This information is not intended to replace advice given to you by your health care provider. Make sure you discuss any questions you have with your health care  provider. Document Released: 07/30/2005 Document Revised: 11/20/2015 Document Reviewed: 10/24/2013 Elsevier Interactive Patient Education  [employer].

## 2017-08-04 ENCOUNTER — Ambulatory Visit (AMBULATORY_SURGERY_CENTER): Payer: Self-pay | Admitting: *Deleted

## 2017-08-04 ENCOUNTER — Other Ambulatory Visit: Payer: Self-pay

## 2017-08-04 VITALS — Ht 69.0 in | Wt 177.8 lb

## 2017-08-04 DIAGNOSIS — Z1211 Encounter for screening for malignant neoplasm of colon: Secondary | ICD-10-CM

## 2017-08-04 MED ORDER — NA SULFATE-K SULFATE-MG SULF 17.5-3.13-1.6 GM/177ML PO SOLN
1.0000 [IU] | Freq: Once | ORAL | 0 refills | Status: AC
Start: 2017-08-04 — End: 2017-08-04

## 2017-08-04 NOTE — Progress Notes (Signed)
No egg or soy allergy known to patient  No issues with past sedation with any surgeries  or procedures, no intubation problems  No diet pills per patient No home 02 use per patient  No blood thinners per patient  Pt denies issues with constipation  No A fib or A flutter  EMMI video sent to pt's e mail  

## 2017-08-08 ENCOUNTER — Encounter: Payer: 59 | Admitting: Gastroenterology

## 2017-08-12 ENCOUNTER — Encounter: Payer: Self-pay | Admitting: Gastroenterology

## 2017-08-24 ENCOUNTER — Ambulatory Visit (AMBULATORY_SURGERY_CENTER): Payer: 59 | Admitting: Gastroenterology

## 2017-08-24 ENCOUNTER — Encounter: Payer: Self-pay | Admitting: Gastroenterology

## 2017-08-24 ENCOUNTER — Other Ambulatory Visit: Payer: Self-pay

## 2017-08-24 VITALS — BP 111/75 | HR 60 | Temp 97.3°F | Resp 28 | Ht 69.0 in | Wt 177.0 lb

## 2017-08-24 DIAGNOSIS — Z1212 Encounter for screening for malignant neoplasm of rectum: Secondary | ICD-10-CM | POA: Diagnosis not present

## 2017-08-24 DIAGNOSIS — Z1211 Encounter for screening for malignant neoplasm of colon: Secondary | ICD-10-CM

## 2017-08-24 MED ORDER — SODIUM CHLORIDE 0.9 % IV SOLN: 500.0000 mL | Freq: Once | INTRAVENOUS | Status: DC

## 2017-08-24 NOTE — Progress Notes (Signed)
Report given to PACU, vss 

## 2017-08-24 NOTE — Progress Notes (Signed)
Pt's states no medical or surgical changes since previsit or office visit. 

## 2017-08-24 NOTE — Op Note (Signed)
[facility] Patient Name: Randy Mendez Procedure Date: 08/24/2017 8:05 AM MRN: [id number] Endoscopist: Jim [place]. Loletha Carrow , MD Age: 52 Referring MD:  Date of Birth: 07-Nov-1965 Gender: Male Account #: [id number] Procedure:                Colonoscopy Indications:              Screening for colorectal malignant neoplasm, This                            is the patient's first colonoscopy Medicines:                Monitored Anesthesia Care Procedure:                Pre-Anesthesia Assessment:                           - Prior to the procedure, a History and Physical                            was performed, and patient medications and                            allergies were reviewed. The patient's tolerance of                            previous anesthesia was also reviewed. The risks                            and benefits of the procedure and the sedation                            options and risks were discussed with the patient.                            All questions were answered, and informed consent                            was obtained. Prior Anticoagulants: The patient has                            taken no previous anticoagulant or antiplatelet                            agents. ASA Grade Assessment: I - A normal, healthy                            patient. After reviewing the risks and benefits,                            the patient was deemed in satisfactory condition to                            undergo the procedure.  After obtaining informed consent, the colonoscope                            was passed under direct vision. Throughout the                            procedure, the patient's blood pressure, pulse, and                            oxygen saturations were monitored continuously. The                            Model CF-HQ190L [id number]) scope was introduced                            through the anus and advanced to the  the terminal                            ileum. The colonoscopy was performed without                            difficulty. The patient tolerated the procedure                            well. The quality of the bowel preparation was                            excellent. The terminal ileum, ileocecal valve,                            appendiceal orifice, and rectum were photographed.                            The quality of the bowel preparation was evaluated                            using the BBPS [facility] Bowel Preparation Scale)                            with scores of: Right Colon = 3, Transverse Colon =                            3 and Left Colon = 3 (entire mucosa seen well with                            no residual staining, small fragments of stool or                            opaque liquid). The total BBPS score equals 9. Scope In: 8:16:40 AM Scope Out: 8:28:46 AM Scope Withdrawal Time: 0 hours 10 minutes 13 seconds  Total Procedure Duration: 0 hours 12 minutes 6 seconds  Findings:  The perianal and digital rectal examinations were                            normal.                           Multiple small-mouthed diverticula were found in                            the left colon.                           The exam was otherwise without abnormality on                            direct and retroflexion views. Complications:            No immediate complications. Estimated Blood Loss:     Estimated blood loss: none. Impression:               - Diverticulosis in the left colon.                           - The examination was otherwise normal on direct                            and retroflexion views.                           - No specimens collected. Recommendation:           - Patient has a contact number available for                            emergencies. The signs and symptoms of potential                            delayed complications were discussed  with the                            patient. Return to normal activities tomorrow.                            Written discharge instructions were provided to the                            patient.                           - Resume previous diet.                           - Continue present medications.                           - Repeat colonoscopy in 10 years for screening  purposes. Chancy Smigiel L. Loletha Carrow, MD 08/24/2017 8:33:18 AM This report has been signed electronically.

## 2017-08-24 NOTE — Patient Instructions (Signed)
**   Handout given on diverticulosis **   YOU HAD AN ENDOSCOPIC PROCEDURE TODAY AT THE [facility]:   Refer to the procedure report that was given to you for any specific questions about what was found during the examination.  If the procedure report does not answer your questions, please call your gastroenterologist to clarify.  If you requested that your care partner not be given the details of your procedure findings, then the procedure report has been included in a sealed envelope for you to review at your convenience later.  YOU SHOULD EXPECT: Some feelings of bloating in the abdomen. Passage of more gas than usual.  Walking can help get rid of the air that was put into your GI tract during the procedure and reduce the bloating. If you had a lower endoscopy (such as a colonoscopy or flexible sigmoidoscopy) you may notice spotting of blood in your stool or on the toilet paper. If you underwent a bowel prep for your procedure, you may not have a normal bowel movement for a few days.  Please Note:  You might notice some irritation and congestion in your nose or some drainage.  This is from the oxygen used during your procedure.  There is no need for concern and it should clear up in a day or so.  SYMPTOMS TO REPORT IMMEDIATELY:   Following lower endoscopy (colonoscopy or flexible sigmoidoscopy):  Excessive amounts of blood in the stool  Significant tenderness or worsening of abdominal pains  Swelling of the abdomen that is new, acute  Fever of 100F or higher  For urgent or emergent issues, a gastroenterologist can be reached at any hour by calling [phone].   DIET:  We do recommend a small meal at first, but then you may proceed to your regular diet.  Drink plenty of fluids but you should avoid alcoholic beverages for 24 hours.  ACTIVITY:  You should plan to take it easy for the rest of today and you should NOT DRIVE or use heavy machinery until tomorrow (because of the  sedation medicines used during the test).    FOLLOW UP: Our staff will call the number listed on your records the next business day following your procedure to check on you and address any questions or concerns that you may have regarding the information given to you following your procedure. If we do not reach you, we will leave a message.  However, if you are feeling well and you are not experiencing any problems, there is no need to return our call.  We will assume that you have returned to your regular daily activities without incident.  If any biopsies were taken you will be contacted by phone or by letter within the next 1-3 weeks.  Please call us at [phone] if you have not heard about the biopsies in 3 weeks.    SIGNATURES/CONFIDENTIALITY: You and/or your care partner have signed paperwork which will be entered into your electronic medical record.  These signatures attest to the fact that that the information above on your After Visit Summary has been reviewed and is understood.  Full responsibility of the confidentiality of this discharge information lies with you and/or your care-partner. 

## 2017-08-25 ENCOUNTER — Telehealth: Payer: Self-pay

## 2017-08-25 NOTE — Telephone Encounter (Signed)
  Follow up Call-  Call Kewanna Kasprzak number 08/24/2017  Post procedure Call Marenda Accardi phone  # [phone]  Permission to leave phone message Yes  Some recent data might be hidden     Patient questions:  Do you have a fever, pain , or abdominal swelling? No. Pain Score  0 *  Have you tolerated food without any problems? Yes.    Have you been able to return to your normal activities? Yes.    Do you have any questions about your discharge instructions: Diet   No. Medications  No. Follow up visit  No.  Do you have questions or concerns about your Care? No.  Actions: * If pain score is 4 or above: No action needed, pain <4.

## 2017-10-19 ENCOUNTER — Telehealth: Payer: Self-pay | Admitting: Family Medicine

## 2017-10-19 NOTE — Telephone Encounter (Signed)
Pt picked up his form and there was not a charge per Rachel Bo for his [employer] Physical Form being completed.

## 2017-10-19 NOTE — Telephone Encounter (Unsigned)
Copied from [employer] #[id number]. Topic: Quick Communication - See Telephone Encounter >> Oct 19, 2017  9:32 AM Hewitt Shorts wrote: CRM for notification. See Telephone encounter for: 10/19/17.pt  is very anxoius to see if the pe form he dropped off around 4/8 or 49 has been completed he stated that he has left several messages and that itis a C on the top of the form and if he needs to bring in a new one he can please let him know   Best number [phone]

## 2018-01-12 DIAGNOSIS — D485 Neoplasm of uncertain behavior of skin: Secondary | ICD-10-CM | POA: Diagnosis not present

## 2018-01-12 DIAGNOSIS — L821 Other seborrheic keratosis: Secondary | ICD-10-CM | POA: Diagnosis not present

## 2018-01-12 DIAGNOSIS — L570 Actinic keratosis: Secondary | ICD-10-CM | POA: Diagnosis not present

## 2018-01-12 DIAGNOSIS — L820 Inflamed seborrheic keratosis: Secondary | ICD-10-CM | POA: Diagnosis not present

## 2018-01-12 DIAGNOSIS — D1801 Hemangioma of skin and subcutaneous tissue: Secondary | ICD-10-CM | POA: Diagnosis not present

## 2018-04-25 ENCOUNTER — Encounter: Payer: Self-pay | Admitting: Family Medicine

## 2018-04-25 ENCOUNTER — Ambulatory Visit (INDEPENDENT_AMBULATORY_CARE_PROVIDER_SITE_OTHER): Payer: 59 | Admitting: Family Medicine

## 2018-04-25 VITALS — BP 118/64 | HR 81 | Temp 98.2°F | Resp 12 | Wt 173.0 lb

## 2018-04-25 DIAGNOSIS — J029 Acute pharyngitis, unspecified: Secondary | ICD-10-CM

## 2018-04-25 DIAGNOSIS — H6980 Other specified disorders of Eustachian tube, unspecified ear: Secondary | ICD-10-CM

## 2018-04-25 DIAGNOSIS — J069 Acute upper respiratory infection, unspecified: Secondary | ICD-10-CM

## 2018-04-25 LAB — POCT RAPID STREP A (OFFICE): Rapid Strep A Screen: NEGATIVE

## 2018-04-25 NOTE — Progress Notes (Signed)
ACUTE VISIT  HPI:  Chief Complaint  Patient presents with  . Sore Throat    x 1week, painful to swallow, slight cough,     Randy Mendez is a 52 y.o.male here today complaining of 5 days of sore throat and left ear "discomfort." He has history of allergy rhinitis, he has had some rhinorrhea and cough, both he attributes to allergies. He has not noted chills, fever, body aches, ear drainage, or hearing changes.  He denies dysphasia or stridor.  Today he feels much better.  Sore Throat   This is a new problem. The current episode started in the past 7 days. The problem has been gradually improving. There has been no fever. The pain is moderate. Associated symptoms include coughing and ear pain. Pertinent negatives include no abdominal pain, diarrhea, headaches, hoarse voice, neck pain, shortness of breath, stridor, swollen glands, trouble swallowing or vomiting. He has had no exposure to strep or mono. He has tried NSAIDs for the symptoms. The treatment provided moderate relief.    No Hx of recent travel. His daughter was sick recently with a viral URI. No known insect bite.  Hx of allergies: Yes, allergic rhinitis.  Currently she is on Nasonex intranasal spray daily as needed.   OTC medications for this problem: Sudafed and Ibuprofen.   Review of Systems  Constitutional: Positive for fatigue. Negative for activity change, appetite change, chills and fever.  HENT: Positive for ear pain, rhinorrhea and sore throat. Negative for hearing loss, hoarse voice, mouth sores, sinus pain, sneezing, trouble swallowing and voice change.   Eyes: Negative for discharge, redness and itching.  Respiratory: Positive for cough. Negative for chest tightness, shortness of breath, wheezing and stridor.   Gastrointestinal: Negative for abdominal pain, diarrhea, nausea and vomiting.  Musculoskeletal: Negative for myalgias and neck pain.  Skin: Negative for rash.    Allergic/Immunologic: Positive for environmental allergies.  Neurological: Negative for weakness and headaches.      Current Outpatient Medications on File Prior to Visit  Medication Sig Dispense Refill  . ibuprofen (ADVIL,MOTRIN) 200 MG tablet Take 200 mg by mouth every 6 (six) hours as needed for mild pain.    Marland Kitchen loratadine (CLARITIN) 10 MG tablet Take 10 mg by mouth daily as needed for allergies.    . mometasone (NASONEX) 50 MCG/ACT nasal spray Place 2 sprays into the nose as needed.     Current Facility-Administered Medications on File Prior to Visit  Medication Dose Route Frequency Provider Last Rate Last Dose  . 0.9 %  sodium chloride infusion  500 mL Intravenous Once Doran Stabler, MD         Past Medical History:  Diagnosis Date  . Allergy   . Diverticulosis   . Hyperlipidemia    borderline   No Known Allergies  Social History   Socioeconomic History  . Marital status: Married    Spouse name: Not on file  . Number of children: Not on file  . Years of education: Not on file  . Highest education level: Not on file  Occupational History  . Not on file  Social Needs  . Financial resource strain: Not on file  . Food insecurity:    Worry: Not on file    Inability: Not on file  . Transportation needs:    Medical: Not on file    Non-medical: Not on file  Tobacco Use  . Smoking status: Former Smoker    Types:  Cigars  . Smokeless tobacco: Never Used  . Tobacco comment: during college years  Substance and Sexual Activity  . Alcohol use: Yes    Comment: occasionally  . Drug use: No  . Sexual activity: Not on file  Lifestyle  . Physical activity:    Days per week: Not on file    Minutes per session: Not on file  . Stress: Not on file  Relationships  . Social connections:    Talks on phone: Not on file    Gets together: Not on file    Attends religious service: Not on file    Active member of club or organization: Not on file    Attends meetings of  clubs or organizations: Not on file    Relationship status: Not on file  Other Topics Concern  . Not on file  Social History Narrative  . Not on file    Vitals:   04/25/18 0958  BP: 118/64  Pulse: 81  Resp: 12  Temp: 98.2 F (36.8 C)  SpO2: 97%   Body mass index is 25.55 kg/m.   Physical Exam  Nursing note and vitals reviewed. Constitutional: He is oriented to person, place, and time. He appears well-developed and well-nourished. He does not appear ill. No distress.  HENT:  Head: Normocephalic and atraumatic.  Right Ear: External ear and ear canal normal. A middle ear effusion is present.  Left Ear: External ear and ear canal normal. A middle ear effusion is present.  Nose: Rhinorrhea present. Right sinus exhibits no maxillary sinus tenderness and no frontal sinus tenderness. Left sinus exhibits no maxillary sinus tenderness and no frontal sinus tenderness.  Mouth/Throat: Uvula is midline and mucous membranes are normal. Posterior oropharyngeal erythema present. No oropharyngeal exudate or posterior oropharyngeal edema.  Eyes: Conjunctivae and EOM are normal.  Cardiovascular: Normal rate and regular rhythm.  No murmur heard. Respiratory: Effort normal and breath sounds normal. No stridor. No respiratory distress.  Lymphadenopathy:       Head (right side): No submandibular adenopathy present.       Head (left side): No submandibular adenopathy present.    He has no cervical adenopathy.  Neurological: He is alert and oriented to person, place, and time. He has normal strength.  Skin: Skin is warm. No rash noted. No erythema.  Psychiatric: He has a normal mood and affect.  Well groomed, good eye contact.      ASSESSMENT AND PLAN:   Randy Mendez was seen today for sore throat.  Diagnoses and all orders for this visit:  URI, acute Symptoms suggests a viral etiology,symptomatic treatment recommended. Instructed to monitor for signs of complications, including new onset of  fever among some, clearly instructed about warning signs.  F/U as needed.  -     POCT rapid strep A  Dysfunction of Eustachian tube, unspecified laterality Autoinflation maneuvers and OTC decongestants may help.  Acute pharyngitis, unspecified etiology Sore throat improving. Rapid strep negative. He does not want sample to be sent for Cx due to high deductible.  -     POCT rapid strep A        Betty G. Martinique, MD  [facility]. [place] office.

## 2018-04-25 NOTE — Patient Instructions (Addendum)
  [name] I have seen you today for an acute visit.  A few things to remember from today's visit:   Dysfunction of Eustachian tube, unspecified laterality  URI, acute  Acute pharyngitis, unspecified etiology   If medications prescribed today, they will not be refill upon request, a follow up appointment with PCP will be necessary to discuss continuation of of treatment if appropriate.   Symptomatic treatment: Over the counter Acetaminophen 500 mg and/or Ibuprofen (400-600 mg) if there is not contraindications; you can alternate in between both every 4-6 hours. Gargles with saline water and throat lozenges might also help. Cold fluids.    Seek prompt medical evaluation if you are having difficulty breathing, mouth swelling, throat closing up, not able to swallow liquids (drooling), skin rash/bruising, or worsening symptoms.  Please follow up in 2 weeks if not any better.     In general please monitor for signs of worsening symptoms and seek immediate medical attention if any concerning.   I hope you get better soon!

## 2019-06-26 ENCOUNTER — Ambulatory Visit: Payer: BLUE CROSS/BLUE SHIELD | Attending: Internal Medicine

## 2019-06-26 DIAGNOSIS — Z20822 Contact with and (suspected) exposure to covid-19: Secondary | ICD-10-CM

## 2019-06-27 LAB — NOVEL CORONAVIRUS, NAA: SARS-CoV-2, NAA: NOT DETECTED

## 2019-10-04 IMAGING — CT CT ABD-PELV W/ CM
2 of 5 series · 16 of 46 positions shown, 18 images · IV contrast (iopamidol)
Comparison: None.

CLINICAL DATA: Left lower quadrant pain for several months, initial
encounter

EXAM:
CT ABDOMEN AND PELVIS WITH CONTRAST
TECHNIQUE: Multidetector CT imaging of the abdomen and pelvis was performed
using the standard protocol following bolus administration of
intravenous contrast.
CONTRAST:  100mL BYWYXE-3OO IOPAMIDOL (BYWYXE-3OO) INJECTION 61%

[Series 2: abd/pel w · axial · 0.74mm/px · z∈[-521,-106]mm · 13 of 93 slices shown, 15 images]
[im 5/93  soft-tissue]
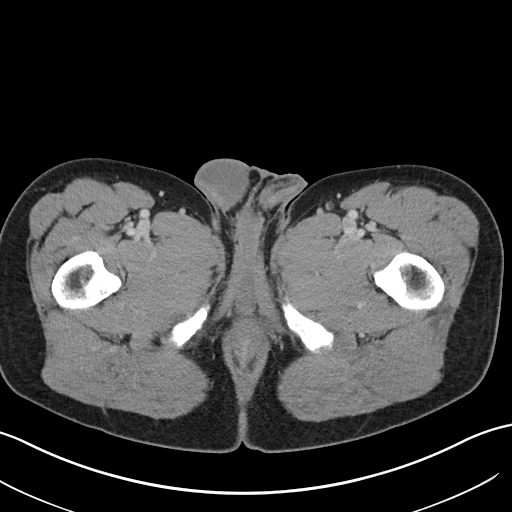
[im 5/93  bone]
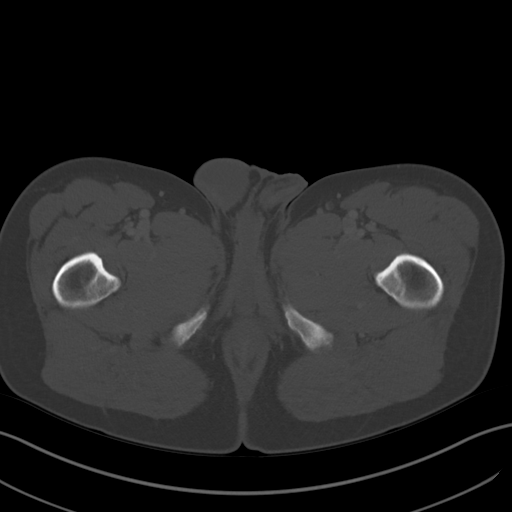
[im 14/93  soft-tissue]
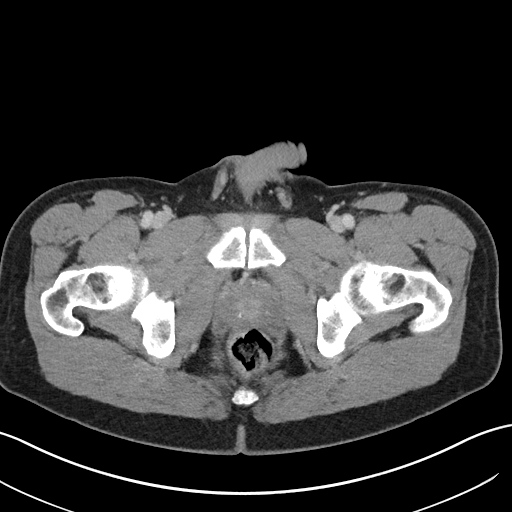
[im 19/93  soft-tissue]
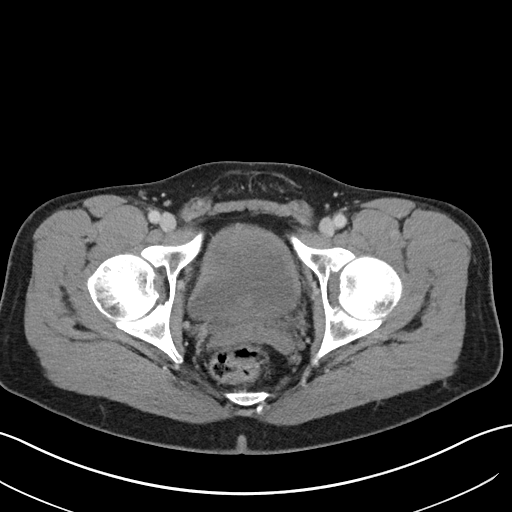
[im 28/93  soft-tissue]
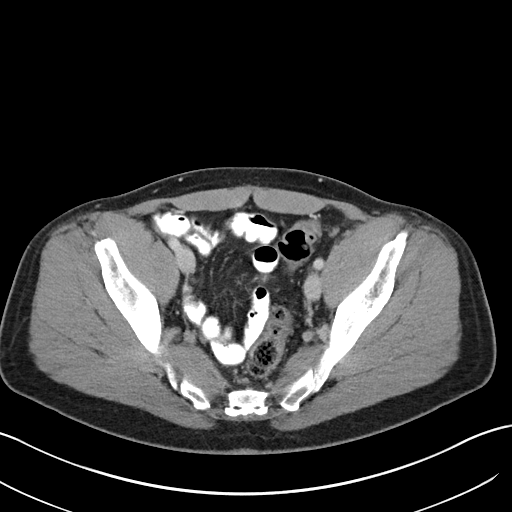
[im 33/93  soft-tissue]
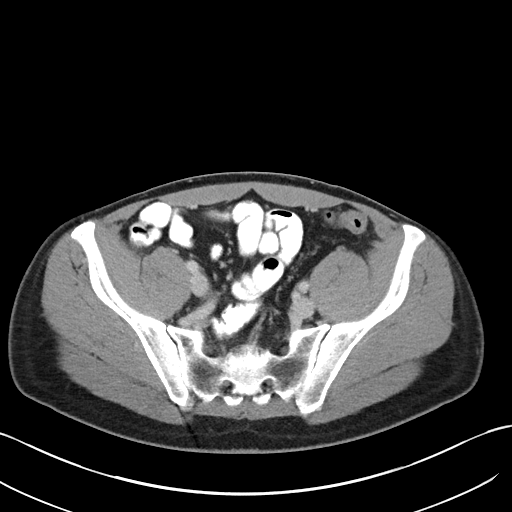
[im 42/93  soft-tissue]
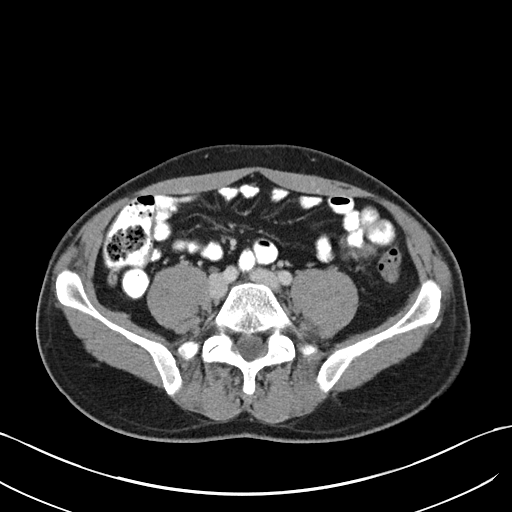
[im 47/93  soft-tissue]
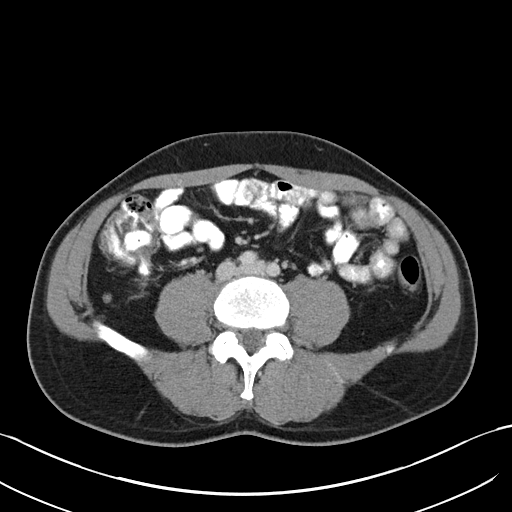
[im 51/93  soft-tissue]
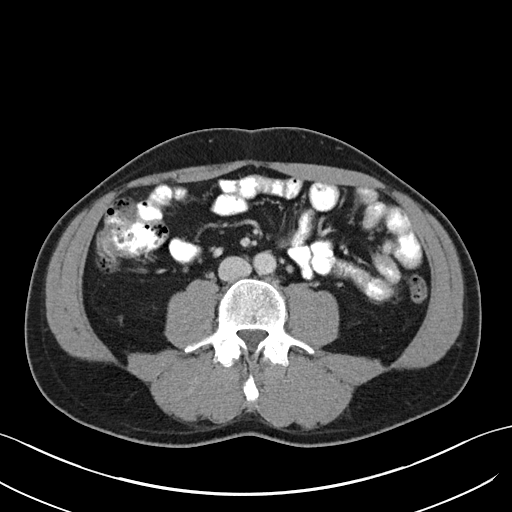
[im 60/93  soft-tissue]
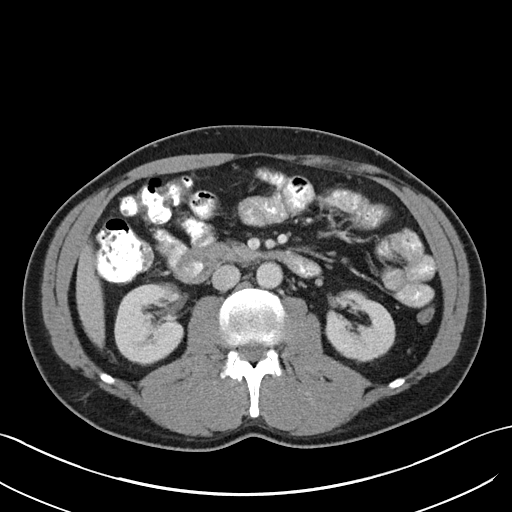
[im 60/93  bone]
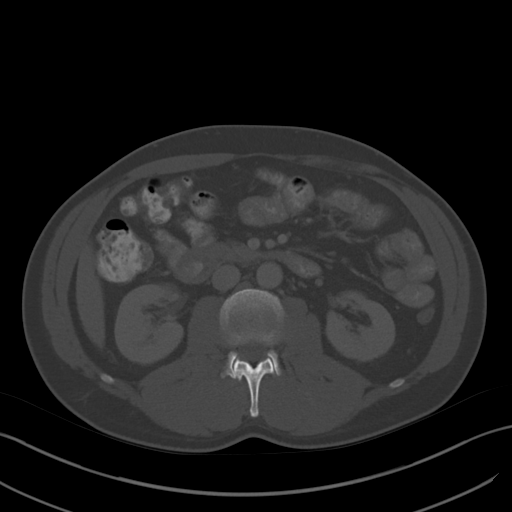
[im 65/93  soft-tissue]
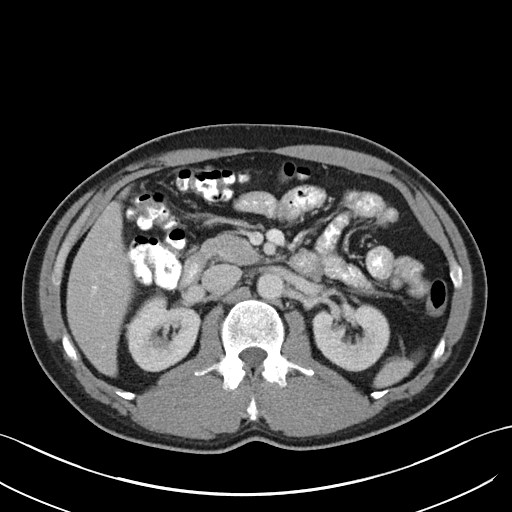
[im 74/93  soft-tissue]
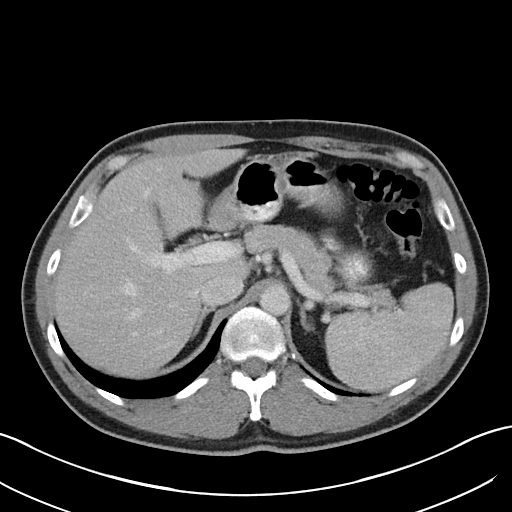
[im 79/93  soft-tissue]
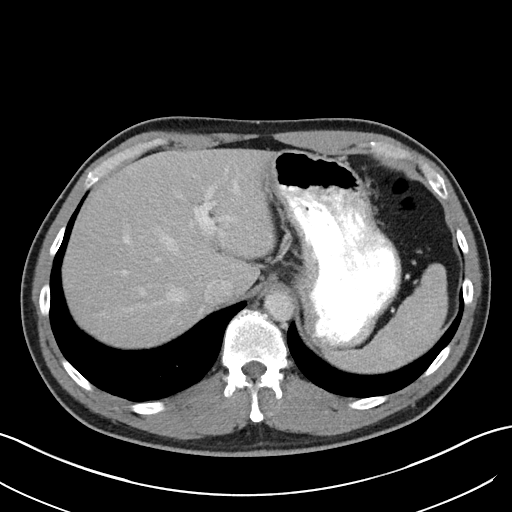
[im 88/93  soft-tissue]
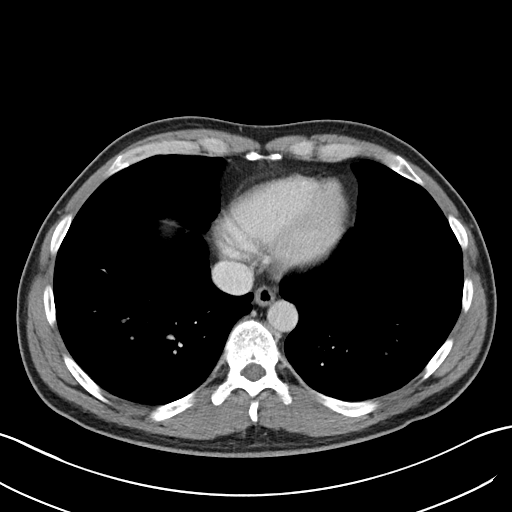

[Series 6: abd/pel w st · coronal · 0.69mm/px · 3 of 77 slices shown]
[im 26/77  soft-tissue]
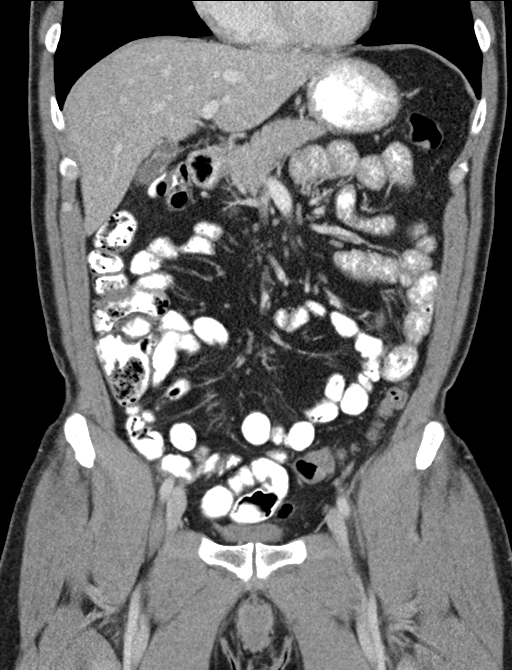
[im 34/77  soft-tissue]
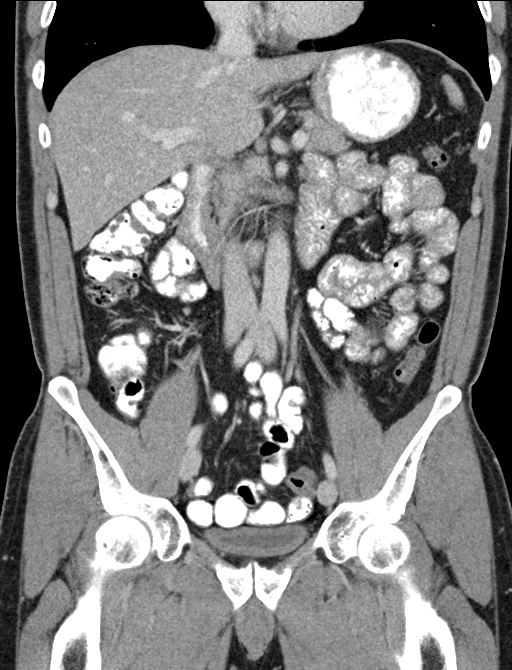
[im 43/77  soft-tissue]
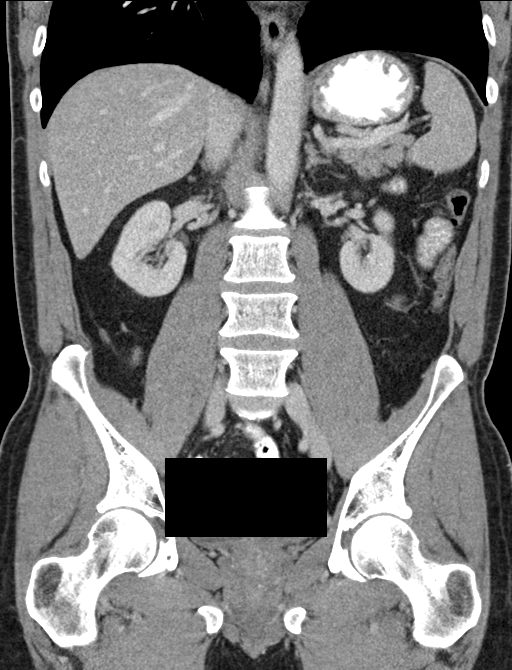

[16 of 46 positions shown; findings below may reference images not displayed]

FINDINGS: Lower chest: No acute abnormality.

Hepatobiliary: Mild fatty infiltration of the liver is noted. The
gallbladder is within normal limits.

Pancreas: Unremarkable. No pancreatic ductal dilatation or
surrounding inflammatory changes.

Spleen: Normal in size without focal abnormality.

Adrenals/Urinary Tract: Adrenal glands are unremarkable. Kidneys are
normal, without renal calculi, focal lesion, or hydronephrosis.
Bladder is unremarkable.

Stomach/Bowel: Mild diverticular changes noted without significant
diverticulitis. The appendix is within normal limits. No obstructive
or inflammatory changes are seen.

Vascular/Lymphatic: No significant vascular findings are present. No
enlarged abdominal or pelvic lymph nodes.

Reproductive: Prostate is unremarkable.

Other: No abdominal wall hernia or abnormality. No abdominopelvic
ascites.

Musculoskeletal: Mild degenerative changes of the lumbar spine
IMPRESSION: Diverticulosis without diverticulitis.

Fatty infiltration of the liver.

No acute abnormality is noted.

## 2019-10-05 ENCOUNTER — Other Ambulatory Visit: Payer: BLUE CROSS/BLUE SHIELD

## 2019-10-08 ENCOUNTER — Other Ambulatory Visit: Payer: BLUE CROSS/BLUE SHIELD

## 2019-10-23 ENCOUNTER — Encounter: Payer: Self-pay | Admitting: Family Medicine

## 2019-10-23 ENCOUNTER — Telehealth (INDEPENDENT_AMBULATORY_CARE_PROVIDER_SITE_OTHER): Payer: BLUE CROSS/BLUE SHIELD | Admitting: Family Medicine

## 2019-10-23 DIAGNOSIS — K529 Noninfective gastroenteritis and colitis, unspecified: Secondary | ICD-10-CM

## 2019-10-23 NOTE — Patient Instructions (Signed)
Diarrhea, Adult Diarrhea is frequent loose and watery bowel movements. Diarrhea can make you feel weak and cause you to become dehydrated. Dehydration can make you tired and thirsty, cause you to have a dry mouth, and decrease how often you urinate. Diarrhea typically lasts 2-3 days. However, it can last longer if it is a sign of something more serious. It is important to treat your diarrhea as told by your health care provider. Follow these instructions at home: Eating and drinking     Follow these recommendations as told by your health care provider:  Take an oral rehydration solution (ORS). This is an over-the-counter medicine that helps return your body to its normal balance of nutrients and water. It is found at pharmacies and retail stores.  Drink plenty of fluids, such as water, ice chips, diluted fruit juice, and low-calorie sports drinks. You can drink milk also, if desired.  Avoid drinking fluids that contain a lot of sugar or caffeine, such as energy drinks, sports drinks, and soda.  Eat bland, easy-to-digest foods in small amounts as you are able. These foods include bananas, applesauce, rice, lean meats, toast, and crackers.  Avoid alcohol.  Avoid spicy or fatty foods.  Medicines  Take over-the-counter and prescription medicines only as told by your health care provider.  If you were prescribed an antibiotic medicine, take it as told by your health care provider. Do not stop using the antibiotic even if you start to feel better. General instructions   Wash your hands often using soap and water. If soap and water are not available, use a hand sanitizer. Others in the household should wash their hands as well. Hands should be washed: ? After using the toilet or changing a diaper. ? Before preparing, cooking, or serving food. ? While caring for a sick person or while visiting someone in a hospital.  Drink enough fluid to keep your urine pale yellow.  Rest at home while  you recover.  Watch your condition for any changes.  Take a warm bath to relieve any burning or pain from frequent diarrhea episodes.  Keep all follow-up visits as told by your health care provider. This is important. Contact a health care provider if:  You have a fever.  Your diarrhea gets worse.  You have new symptoms.  You cannot keep fluids down.  You feel light-headed or dizzy.  You have a headache.  You have muscle cramps. Get help right away if:  You have chest pain.  You feel extremely weak or you faint.  You have bloody or black stools or stools that look like tar.  You have severe pain, cramping, or bloating in your abdomen.  You have trouble breathing or you are breathing very quickly.  Your heart is beating very quickly.  Your skin feels cold and clammy.  You feel confused.  You have signs of dehydration, such as: ? Dark urine, very little urine, or no urine. ? Cracked lips. ? Dry mouth. ? Sunken eyes. ? Sleepiness. ? Weakness. Summary  Diarrhea is frequent loose and watery bowel movements. Diarrhea can make you feel weak and cause you to become dehydrated.  Drink enough fluids to keep your urine pale yellow.  Make sure that you wash your hands after using the toilet. If soap and water are not available, use hand sanitizer.  Contact a health care provider if your diarrhea gets worse or you have new symptoms.  Get help right away if you have signs of dehydration. This   information is not intended to replace advice given to you by your health care provider. Make sure you discuss any questions you have with your health care provider. Document Revised: 10/31/2018 Document Reviewed: 11/18/2017 Elsevier Patient Education  2020 [place] Choices to Help Relieve Diarrhea, Adult When you have diarrhea, the foods you eat and your eating habits are very important. Choosing the right foods and drinks can help:  Relieve diarrhea.  Replace  lost fluids and nutrients.  Prevent dehydration. What general guidelines should I follow?  Relieving diarrhea  Choose foods with less than 2 g or .07 oz. of fiber per serving.  Limit fats to less than 8 tsp (38 g or 1.34 oz.) a day.  Avoid the following: ? Foods and beverages sweetened with high-fructose corn syrup, honey, or sugar alcohols such as xylitol, sorbitol, and mannitol. ? Foods that contain a lot of fat or sugar. ? Fried, greasy, or spicy foods. ? High-fiber grains, breads, and cereals. ? Raw fruits and vegetables.  Eat foods that are rich in probiotics. These foods include dairy products such as yogurt and fermented milk products. They help increase healthy bacteria in the stomach and intestines (gastrointestinal tract, or GI tract).  If you have lactose intolerance, avoid dairy products. These may make your diarrhea worse.  Take medicine to help stop diarrhea (antidiarrheal medicine) only as told by your health care provider. Replacing nutrients  Eat small meals or snacks every 3-4 hours.  Eat bland foods, such as white rice, toast, or baked potato, until your diarrhea starts to get better. Gradually reintroduce nutrient-rich foods as tolerated or as told by your health care provider. This includes: ? Well-cooked protein foods. ? Peeled, seeded, and soft-cooked fruits and vegetables. ? Low-fat dairy products.  Take vitamin and mineral supplements as told by your health care provider. Preventing dehydration  Start by sipping water or a special solution to prevent dehydration (oral rehydration solution, ORS). Urine that is clear or pale yellow means that you are getting enough fluid.  Try to drink at least 8-10 cups of fluid each day to help replace lost fluids.  You may add other liquids in addition to water, such as clear juice or decaffeinated sports drinks, as tolerated or as told by your health care provider.  Avoid drinks with caffeine, such as coffee, tea,  or soft drinks.  Avoid alcohol. What foods are recommended?     The items listed may not be a complete list. Talk with your health care provider about what dietary choices are best for you. Grains White rice. White, [place], or pita breads (fresh or toasted), including plain rolls, buns, or bagels. White pasta. Saltine, soda, or graham crackers. Pretzels. Low-fiber cereal. Cooked cereals made with water (such as cornmeal, farina, or cream cereals). Plain muffins. Matzo. Melba toast. Zwieback. Vegetables Potatoes (without the skin). Most well-cooked and canned vegetables without skins or seeds. Tender lettuce. Fruits Apple sauce. Fruits canned in juice. Cooked apricots, cherries, grapefruit, peaches, pears, or plums. Fresh bananas and cantaloupe. Meats and other protein foods Baked or boiled chicken. Eggs. Tofu. Fish. Seafood. Smooth nut butters. Ground or well-cooked tender beef, ham, veal, lamb, pork, or poultry. Dairy Plain yogurt, kefir, and unsweetened liquid yogurt. Lactose-free milk, buttermilk, skim milk, or soy milk. Low-fat or nonfat hard cheese. Beverages Water. Low-calorie sports drinks. Fruit juices without pulp. Strained tomato and vegetable juices. Decaffeinated teas. Sugar-free beverages not sweetened with sugar alcohols. Oral rehydration solutions, if approved by your health care  provider. Seasoning and other foods Bouillon, broth, or soups made from recommended foods. What foods are not recommended? The items listed may not be a complete list. Talk with your health care provider about what dietary choices are best for you. Grains Whole grain, whole wheat, bran, or rye breads, rolls, pastas, and crackers. Wild or brown rice. Whole grain or bran cereals. Barley. Oats and oatmeal. Corn tortillas or taco shells. Granola. Popcorn. Vegetables Raw vegetables. Fried vegetables. Cabbage, broccoli, Brussels sprouts, artichokes, baked beans, beet greens, corn, kale, legumes, peas,  sweet potatoes, and yams. Potato skins. Cooked spinach and cabbage. Fruits Dried fruit, including raisins and dates. Raw fruits. Stewed or dried prunes. Canned fruits with syrup. Meat and other protein foods Fried or fatty meats. Deli meats. Chunky nut butters. Nuts and seeds. Beans and lentils. Berniece Salines. Hot dogs. Sausage. Dairy High-fat cheeses. Whole milk, chocolate milk, and beverages made with milk, such as milk shakes. Half-and-half. Cream. sour cream. Ice cream. Beverages Caffeinated beverages (such as coffee, tea, soda, or energy drinks). Alcoholic beverages. Fruit juices with pulp. Prune juice. Soft drinks sweetened with high-fructose corn syrup or sugar alcohols. High-calorie sports drinks. Fats and oils Butter. Cream sauces. Margarine. Salad oils. Plain salad dressings. Olives. Avocados. Mayonnaise. Sweets and desserts Sweet rolls, doughnuts, and sweet breads. Sugar-free desserts sweetened with sugar alcohols such as xylitol and sorbitol. Seasoning and other foods Honey. Hot sauce. Chili powder. Gravy. Cream-based or milk-based soups. Pancakes and waffles. Summary  When you have diarrhea, the foods you eat and your eating habits are very important.  Make sure you get at least 8-10 cups of fluid each day, or enough to keep your urine clear or pale yellow.  Eat bland foods and gradually reintroduce healthy, nutrient-rich foods as tolerated, or as told by your health care provider.  Avoid high-fiber, fried, greasy, or spicy foods. This information is not intended to replace advice given to you by your health care provider. Make sure you discuss any questions you have with your health care provider. Document Revised: 10/05/2018 Document Reviewed: 06/11/2016 Elsevier Patient Education  [place].

## 2019-10-23 NOTE — Progress Notes (Signed)
Virtual Visit via Video Note  I connected with Randy Mendez on 10/23/19 at  3:00 PM EDT by a video enabled telemedicine application 2/2 [id number] pandemic and verified that I am speaking with the correct person using two identifiers.  Location patient: home Location provider:work or home office Persons participating in the virtual visit: patient, provider  I discussed the limitations of evaluation and management by telemedicine and the availability of in person appointments. The patient expressed understanding and agreed to proceed.   HPI: Pt is a 54 yo male with pmh sig for migraines, HLD, seasonal allergies, h/o diverticulosis.  Pt started having diarrhea q 1-2 hours since last night.  Pt endorses chills, nausea, temp 99.8 F. Denies vomiting, BRBPR, dark stools.   Able to drink gatorade and eat crackers today.  Tried pepto bismal.  Had half a Thrive shake this am that has a probiotic in it.   Pt went backpacking over the wknd with his daughter who is a scout.  He ate some dried fruit, [place] jerkey, and pumped water out of a stream with a filter, but others did not get sick.  Taking OTC meds prn for seasonal allergies.  Pt got his 2nd covid vaccine 2 wks ago.     ROS: See pertinent positives and negatives per HPI.  Past Medical History:  Diagnosis Date  . Allergy   . Diverticulosis   . Hyperlipidemia    borderline    Past Surgical History:  Procedure Laterality Date  . TOOTH EXTRACTION    . WISDOM TOOTH EXTRACTION     as a child    Family History  Problem Relation Age of Onset  . Esophageal cancer Maternal Grandfather   . Stomach cancer Paternal Grandmother   . Colon cancer Neg Hx   . Colon polyps Neg Hx   . Rectal cancer Neg Hx     SOCIAL HX: Pt left his job at the [employer].  Pt is working as a first Social worker.  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   Pt states his son, a cadet at [employer], recently developed COVID-52 which caused complications including  appendicitis and a collapsed lung s/p the anesthesia from the appi.   Current Outpatient Medications:  .  ibuprofen (ADVIL,MOTRIN) 200 MG tablet, Take 200 mg by mouth every 6 (six) hours as needed for mild pain., Disp: , Rfl:  .  loratadine (CLARITIN) 10 MG tablet, Take 10 mg by mouth daily as needed for allergies., Disp: , Rfl:  .  mometasone (NASONEX) 50 MCG/ACT nasal spray, Place 2 sprays into the nose as needed., Disp: , Rfl:   Current Facility-Administered Medications:  .  0.9 %  sodium chloride infusion, 500 mL, Intravenous, Once, Danis, Kirke Corin, MD  EXAM:  VITALS per patient if applicable:  RR between 12-20 bpm  GENERAL: alert, oriented, appears well and in no acute distress  HEENT: atraumatic, conjunctiva clear, no obvious abnormalities on inspection of external nose and ears  NECK: normal movements of the head and neck  LUNGS: on inspection no signs of respiratory distress, breathing rate appears normal, no obvious gross SOB, gasping or wheezing  CV: no obvious cyanosis  MS: moves all visible extremities without noticeable abnormality  PSYCH/NEURO: pleasant and cooperative, no obvious depression or anxiety, speech and thought processing grossly intact  ASSESSMENT AND PLAN:  Discussed the following assessment and plan:  Enteritis  -improving -discussed various causes including viral and bacterial (Campylobacter sp.).  Less likely 2/2  Giardia given timing of symptoms and lack of greasy stools. -must also consider COVID-19 infection. -continue supportive care: hydration, BRAT diet, imodium if needed.  Avoid diary, spicy, or greasy foods at this time. -discussed COVID testing -stool studies not indicated at this time. -given precautions.    F/u prn for continued or worsened symptoms.  Pt to schedule CPE in the next few wks.   I discussed the assessment and treatment plan with the patient. The patient was provided an opportunity to ask questions and all were  answered. The patient agreed with the plan and demonstrated an understanding of the instructions.   The patient was advised to call back or seek an in-person evaluation if the symptoms worsen or if the condition fails to improve as anticipated.  I provided 20 minutes of non-face-to-face time during this encounter.   Billie Ruddy, MD

## 2019-10-27 ENCOUNTER — Ambulatory Visit (INDEPENDENT_AMBULATORY_CARE_PROVIDER_SITE_OTHER)
Admission: RE | Admit: 2019-10-27 | Discharge: 2019-10-27 | Disposition: A | Discharge status: Home / Self Care | Payer: BLUE CROSS/BLUE SHIELD | Source: Ambulatory Visit

## 2019-10-27 DIAGNOSIS — R112 Nausea with vomiting, unspecified: Secondary | ICD-10-CM | POA: Diagnosis not present

## 2019-10-27 DIAGNOSIS — R197 Diarrhea, unspecified: Secondary | ICD-10-CM

## 2019-10-27 MED ORDER — DICYCLOMINE HCL 20 MG PO TABS: 20.0000 mg | ORAL_TABLET | Freq: Two times a day (BID) | ORAL | 0 refills | Status: DC

## 2019-10-27 MED ORDER — AZITHROMYCIN 500 MG PO TABS: 500.0000 mg | ORAL_TABLET | Freq: Every day | ORAL | 0 refills | Status: AC

## 2019-10-27 MED ORDER — ONDANSETRON 4 MG PO TBDP: 4.0000 mg | ORAL_TABLET | Freq: Three times a day (TID) | ORAL | 0 refills | Status: DC | PRN

## 2019-10-27 NOTE — Discharge Instructions (Signed)
Zofran for nausea and vomiting as needed. Bentyl for abdominal cramping. Keep hydrated, you urine should be clear to pale yellow in color. Bland diet, advance as tolerated. Monitor for any worsening of symptoms, nausea or vomiting not controlled by medication, worsening abdominal pain, fever, go to the emergency department for further evaluation needed.   If symptoms not improving by 10/29/2019, can fill azithromycin as directed.

## 2019-10-27 NOTE — ED Provider Notes (Signed)
Virtual Visit via Video Note:  [name]  initiated request for Telemedicine visit with [facility] team. I connected with [name]  on 10/27/2019 at 12:02 PM  for a synchronized telemedicine visit using a video enabled HIPPA compliant telemedicine application. I verified that I am speaking with [name]  using two identifiers. [name], PA-C  was physically located in a [facility] Urgent care site and [name] was located at a different location.   The limitations of evaluation and management by telemedicine as well as the availability of in-person appointments were discussed. Patient was informed that he  may incur a bill ( including co-pay) for this virtual visit encounter. Randy Mendez  expressed understanding and gave verbal consent to proceed with virtual visit.     History of Present Illness:Randy Mendez  is a 54 y.o. male presents with continued nausea/vomiting/diarrhea since 10/23/2019. Had PCP vv 10/23/2019, at the time, symptoms were improving, and symptomatic treatment discussed. He states had gone from diarrhea q1-2 hours to 3-4 episodes/day. Denies melena, hematochezia, steatorhea.  Drank milk this morning, causing nausea/vomiting. Since then has had diarrhea q1h. Nausea resolved since this morning. Had temp of 99.8 4 days ago that has since resolved. Having body aches/chills. Has been tolerated fluid intake. Abdominal cramping. COVID PCR negative 10/23/2019.  Past Medical History:  Diagnosis Date  . Allergy   . Diverticulosis   . Hyperlipidemia    borderline    No Known Allergies      Observations/Objective: General: Nontoxic, no acute distress. Head: Normocephalic, atraumatic Eye: No conjunctival injection, eyelid swelling. EOMI ENT: Mucus membranes moist, no lip cracking. No obvious nasal drainage. Pulm: Speaking in full sentences without difficulty. Normal effort. No respiratory distress,  accessory muscle use. Abd: generalized soreness to self palpation. Neuro: Normal mental status. Alert and oriented x 3.   Assessment and Plan: Patient recently went camping, however, symptoms improved prior to worsening. Will start symptomatic management first with bentyl and zofran. Rx of azithromycin called into pharmacy, can start 10/30/2019 if symptoms still not improving. Return precautions given.  Follow Up Instructions:    I discussed the assessment and treatment plan with the patient. The patient was provided an opportunity to ask questions and all were answered. The patient agreed with the plan and demonstrated an understanding of the instructions.   The patient was advised to call back or seek an in-person evaluation if the symptoms worsen or if the condition fails to improve as anticipated.  I provided 10 minutes of non-face-to-face time during this encounter.    [name], PA-C  10/27/2019 12:02 PM         [name], PA-C 10/27/19 1220

## 2019-10-29 ENCOUNTER — Telehealth (INDEPENDENT_AMBULATORY_CARE_PROVIDER_SITE_OTHER): Payer: BLUE CROSS/BLUE SHIELD | Admitting: Family Medicine

## 2019-10-29 ENCOUNTER — Encounter: Payer: Self-pay | Admitting: Family Medicine

## 2019-10-29 DIAGNOSIS — R197 Diarrhea, unspecified: Secondary | ICD-10-CM | POA: Diagnosis not present

## 2019-10-29 DIAGNOSIS — R1084 Generalized abdominal pain: Secondary | ICD-10-CM | POA: Diagnosis not present

## 2019-10-29 NOTE — Progress Notes (Signed)
Virtual Visit via Video Note  I connected with Randy Mendez on 10/29/19 at  4:30 PM EDT by a video enabled telemedicine application 2/2 [id number] pandemic and verified that I am speaking with the correct person using two identifiers.  Location patient: home Location provider:work or home office Persons participating in the virtual visit: patient, provider  I discussed the limitations of evaluation and management by telemedicine and the availability of in person appointments. The patient expressed understanding and agreed to proceed.   HPI: Pt is a 54 yo male with pmh sig for migraines, HLD, h/o diverticulosis, and seasonal allergies.  Pt seen 10/23/2019 for diarrhea after camping.  After last visit, pt noticed improvement in symptoms.  Then on Saturday pt drank milk in a Thrive shake, but started feeling bad.  Had emesis and return of diarrhea.  Pt consider calling EMS 2/2 symptoms including abdominal discomfort.  Pt was seen via e-visit.  Given Rx for Zofran and Bentyl.  Advised to start azithromycin tomorrow for continued symptoms.  Pt denies blood in stools, fever.  Pt drinking fluids and follow-up times the last 2 days.  Had a negative Covid test.   ROS: See pertinent positives and negatives per HPI.  Past Medical History:  Diagnosis Date  . Allergy   . Diverticulosis   . Hyperlipidemia    borderline    Past Surgical History:  Procedure Laterality Date  . TOOTH EXTRACTION    . WISDOM TOOTH EXTRACTION     as a child    Family History  Problem Relation Age of Onset  . Esophageal cancer Maternal Grandfather   . Stomach cancer Paternal Grandmother   . Colon cancer Neg Hx   . Colon polyps Neg Hx   . Rectal cancer Neg Hx      Current Outpatient Medications:  .  [START ON 10/30/2019] azithromycin (ZITHROMAX) 500 MG tablet, Take 1 tablet (500 mg total) by mouth daily for 3 days., Disp: 3 tablet, Rfl: 0 .  dicyclomine (BENTYL) 20 MG tablet, Take 1 tablet (20 mg total) by mouth 2  (two) times daily., Disp: 20 tablet, Rfl: 0 .  ibuprofen (ADVIL,MOTRIN) 200 MG tablet, Take 200 mg by mouth every 6 (six) hours as needed for mild pain., Disp: , Rfl:  .  loratadine (CLARITIN) 10 MG tablet, Take 10 mg by mouth daily as needed for allergies., Disp: , Rfl:  .  mometasone (NASONEX) 50 MCG/ACT nasal spray, Place 2 sprays into the nose as needed., Disp: , Rfl:  .  ondansetron (ZOFRAN ODT) 4 MG disintegrating tablet, Take 1 tablet (4 mg total) by mouth every 8 (eight) hours as needed for nausea or vomiting., Disp: 15 tablet, Rfl: 0  EXAM:  VITALS per patient if applicable: RR between 123456 bpm  GENERAL: alert, oriented, appears well and in no acute distress  HEENT: atraumatic, conjunctiva clear, no obvious abnormalities on inspection of external nose and ears  NECK: normal movements of the head and neck  LUNGS: on inspection no signs of respiratory distress, breathing rate appears normal, no obvious gross SOB, gasping or wheezing  CV: no obvious cyanosis  MS: moves all visible extremities without noticeable abnormality  PSYCH/NEURO: pleasant and cooperative, no obvious depression or anxiety, speech and thought processing grossly intact  ASSESSMENT AND PLAN:  Discussed the following assessment and plan:  Generalized abdominal pain  -Consider diverticulitis given history of diverticulosis. -Given ongoing symptoms will obtain CBC prior to starting antibiotics. -given precautions - Plan: CBC with Differential/Platelet, Comprehensive metabolic  panel  Diarrhea, unspecified type  -Consider diverticulitis given patient's history of diverticulosis.  Parasite less likely as no one eles has become sick from the camping trip.  Covid testing negative. -Given continued symptoms discussed obtaining CBC. -Pt interested in stool studies. -Advised to avoid dairy. -We will advanced diet from liquids to bland -Rx for Bentyl and Zofran sent to pharmacy as well as azithromycin by  provider from ED visit on 5/1. -Given precautions - Plan: CBC with Differential/Platelet, Ova and Parasite Exam, Stool culture  F/u prn   I discussed the assessment and treatment plan with the patient. The patient was provided an opportunity to ask questions and all were answered. The patient agreed with the plan and demonstrated an understanding of the instructions.   The patient was advised to call back or seek an in-person evaluation if the symptoms worsen or if the condition fails to improve as anticipated.  Billie Ruddy, MD

## 2019-10-30 ENCOUNTER — Other Ambulatory Visit (INDEPENDENT_AMBULATORY_CARE_PROVIDER_SITE_OTHER): Payer: BLUE CROSS/BLUE SHIELD

## 2019-10-30 ENCOUNTER — Other Ambulatory Visit: Payer: Self-pay

## 2019-10-30 DIAGNOSIS — R1084 Generalized abdominal pain: Secondary | ICD-10-CM

## 2019-10-30 DIAGNOSIS — R197 Diarrhea, unspecified: Secondary | ICD-10-CM

## 2019-10-30 LAB — CBC WITH DIFFERENTIAL/PLATELET
Basophils Absolute: 0 10*3/uL (ref 0.0–0.1)
Basophils Relative: 0.8 % (ref 0.0–3.0)
Eosinophils Absolute: 0.1 10*3/uL (ref 0.0–0.7)
Eosinophils Relative: 2.2 % (ref 0.0–5.0)
HCT: 45.1 % (ref 39.0–52.0)
Hemoglobin: 15.6 g/dL (ref 13.0–17.0)
Lymphocytes Relative: 34.8 % (ref 12.0–46.0)
Lymphs Abs: 1.6 10*3/uL (ref 0.7–4.0)
MCHC: 34.7 g/dL (ref 30.0–36.0)
MCV: 92.4 fl (ref 78.0–100.0)
Monocytes Absolute: 0.6 10*3/uL (ref 0.1–1.0)
Monocytes Relative: 12.5 % — ABNORMAL HIGH (ref 3.0–12.0)
Neutro Abs: 2.2 10*3/uL (ref 1.4–7.7)
Neutrophils Relative %: 49.7 % (ref 43.0–77.0)
Platelets: 253 10*3/uL (ref 150.0–400.0)
RBC: 4.89 Mil/uL (ref 4.22–5.81)
RDW: 13.2 % (ref 11.5–15.5)
WBC: 4.5 10*3/uL (ref 4.0–10.5)

## 2019-10-30 LAB — COMPREHENSIVE METABOLIC PANEL
ALT: 21 U/L (ref 0–53)
AST: 16 U/L (ref 0–37)
Albumin: 4.4 g/dL (ref 3.5–5.2)
Alkaline Phosphatase: 74 U/L (ref 39–117)
BUN: 10 mg/dL (ref 6–23)
CO2: 27 mEq/L (ref 19–32)
Calcium: 9.1 mg/dL (ref 8.4–10.5)
Chloride: 104 mEq/L (ref 96–112)
Creatinine, Ser: 1.21 mg/dL (ref 0.40–1.50)
GFR: 62.62 mL/min (ref >60.00)
Glucose, Bld: 101 mg/dL — ABNORMAL HIGH (ref 70–99)
Potassium: 3.8 mEq/L (ref 3.5–5.1)
Sodium: 139 mEq/L (ref 135–145)
Total Bilirubin: 1 mg/dL (ref 0.2–1.2)
Total Protein: 6.7 g/dL (ref 6.0–8.3)

## 2019-11-03 LAB — OVA AND PARASITE EXAMINATION
CONCENTRATE RESULT:: NONE SEEN
MICRO NUMBER:: 10437252
SPECIMEN QUALITY:: ADEQUATE
TRICHROME RESULT:: NONE SEEN

## 2019-11-03 LAB — STOOL CULTURE
MICRO NUMBER:: 10437250
MICRO NUMBER:: 10437251
MICRO NUMBER:: 10437253
SHIGA RESULT:: NOT DETECTED
SPECIMEN QUALITY:: ADEQUATE
SPECIMEN QUALITY:: ADEQUATE
SPECIMEN QUALITY:: ADEQUATE

## 2019-11-03 LAB — TIQ-NTM

## 2020-06-02 ENCOUNTER — Encounter: Payer: Self-pay | Admitting: Family Medicine

## 2020-06-02 ENCOUNTER — Other Ambulatory Visit: Payer: Self-pay

## 2020-06-02 ENCOUNTER — Ambulatory Visit (INDEPENDENT_AMBULATORY_CARE_PROVIDER_SITE_OTHER): Payer: BLUE CROSS/BLUE SHIELD | Admitting: Family Medicine

## 2020-06-02 VITALS — BP 110/70 | HR 67 | Temp 98.0°F | Ht 69.0 in | Wt 170.0 lb

## 2020-06-02 DIAGNOSIS — R222 Localized swelling, mass and lump, trunk: Secondary | ICD-10-CM | POA: Diagnosis not present

## 2020-06-02 DIAGNOSIS — Z23 Encounter for immunization: Secondary | ICD-10-CM

## 2020-06-02 DIAGNOSIS — Z0001 Encounter for general adult medical examination with abnormal findings: Secondary | ICD-10-CM | POA: Diagnosis not present

## 2020-06-02 NOTE — Patient Instructions (Signed)
Preventive Care 41-54 Years Old, Male Preventive care refers to lifestyle choices and visits with your health care provider that can promote health and wellness. This includes:  A yearly physical exam. This is also called an annual well check.  Regular dental and eye exams.  Immunizations.  Screening for certain conditions.  Healthy lifestyle choices, such as eating a healthy diet, getting regular exercise, not using drugs or products that contain nicotine and tobacco, and limiting alcohol use. What can I expect for my preventive care visit? Physical exam Your health care provider will check:  Height and weight. These may be used to calculate body mass index (BMI), which is a measurement that tells if you are at a healthy weight.  Heart rate and blood pressure.  Your skin for abnormal spots. Counseling Your health care provider may ask you questions about:  Alcohol, tobacco, and drug use.  Emotional well-being.  Home and relationship well-being.  Sexual activity.  Eating habits.  Work and work Statistician. What immunizations do I need?  Influenza (flu) vaccine  This is recommended every year. Tetanus, diphtheria, and pertussis (Tdap) vaccine  You may need a Td booster every 10 years. Varicella (chickenpox) vaccine  You may need this vaccine if you have not already been vaccinated. Zoster (shingles) vaccine  You may need this after age 64. Measles, mumps, and rubella (MMR) vaccine  You may need at least one dose of MMR if you were born in 1957 or later. You may also need a second dose. Pneumococcal conjugate (PCV13) vaccine  You may need this if you have certain conditions and were not previously vaccinated. Pneumococcal polysaccharide (PPSV23) vaccine  You may need one or two doses if you smoke cigarettes or if you have certain conditions. Meningococcal conjugate (MenACWY) vaccine  You may need this if you have certain conditions. Hepatitis A  vaccine  You may need this if you have certain conditions or if you travel or work in places where you may be exposed to hepatitis A. Hepatitis B vaccine  You may need this if you have certain conditions or if you travel or work in places where you may be exposed to hepatitis B. Haemophilus influenzae type b (Hib) vaccine  You may need this if you have certain risk factors. Human papillomavirus (HPV) vaccine  If recommended by your health care provider, you may need three doses over 6 months. You may receive vaccines as individual doses or as more than one vaccine together in one shot (combination vaccines). Talk with your health care provider about the risks and benefits of combination vaccines. What tests do I need? Blood tests  Lipid and cholesterol levels. These may be checked every 5 years, or more frequently if you are over 60 years old.  Hepatitis C test.  Hepatitis B test. Screening  Lung cancer screening. You may have this screening every year starting at age 43 if you have a 30-pack-year history of smoking and currently smoke or have quit within the past 15 years.  Prostate cancer screening. Recommendations will vary depending on your family history and other risks.  Colorectal cancer screening. All adults should have this screening starting at age 72 and continuing until age 2. Your health care provider may recommend screening at age 14 if you are at increased risk. You will have tests every 1-10 years, depending on your results and the type of screening test.  Diabetes screening. This is done by checking your blood sugar (glucose) after you have not eaten  for a while (fasting). You may have this done every 1-3 years.  Sexually transmitted disease (STD) testing. Follow these instructions at home: Eating and drinking  Eat a diet that includes fresh fruits and vegetables, whole grains, lean protein, and low-fat dairy products.  Take vitamin and mineral supplements as  recommended by your health care provider.  Do not drink alcohol if your health care provider tells you not to drink.  If you drink alcohol: ? Limit how much you have to 0-2 drinks a day. ? Be aware of how much alcohol is in your drink. In the [place] one drink equals one 12 oz bottle of beer (355 mL), one 5 oz glass of wine (148 mL), or one 1 oz glass of hard liquor (44 mL). Lifestyle  Take daily care of your teeth and gums.  Stay active. Exercise for at least 30 minutes on 5 or more days each week.  Do not use any products that contain nicotine or tobacco, such as cigarettes, e-cigarettes, and chewing tobacco. If you need help quitting, ask your health care provider.  If you are sexually active, practice safe sex. Use a condom or other form of protection to prevent STIs (sexually transmitted infections).  Talk with your health care provider about taking a low-dose aspirin every day starting at age 43. What's next?  Go to your health care provider once a year for a well check visit.  Ask your health care provider how often you should have your eyes and teeth checked.  Stay up to date on all vaccines. This information is not intended to replace advice given to you by your health care provider. Make sure you discuss any questions you have with your health care provider. Document Revised: 06/08/2018 Document Reviewed: 06/08/2018 Elsevier Patient Education  [place].  Migraine Headache A migraine headache is an intense, throbbing pain on one side or both sides of the head. Migraine headaches may also cause other symptoms, such as nausea, vomiting, and sensitivity to light and noise. A migraine headache can last from 4 hours to 3 days. Talk with your doctor about what things may bring on (trigger) your migraine headaches. What are the causes? The exact cause of this condition is not known. However, a migraine may be caused when nerves in the brain become irritated and release  chemicals that cause inflammation of blood vessels. This inflammation causes pain. This condition may be triggered or caused by:  Drinking alcohol.  Smoking.  Taking medicines, such as: ? Medicine used to treat chest pain (nitroglycerin). ? Birth control pills. ? Estrogen. ? Certain blood pressure medicines.  Eating or drinking products that contain nitrates, glutamate, aspartame, or tyramine. Aged cheeses, chocolate, or caffeine may also be triggers.  Doing physical activity. Other things that may trigger a migraine headache include:  Menstruation.  Pregnancy.  Hunger.  Stress.  Lack of sleep or too much sleep.  Weather changes.  Fatigue. What increases the risk? The following factors may make you more likely to experience migraine headaches:  Being a certain age. This condition is more common in people who are 71-36 years old.  Being male.  Having a family history of migraine headaches.  Being Caucasian.  Having a mental health condition, such as depression or anxiety.  Being obese. What are the signs or symptoms? The main symptom of this condition is pulsating or throbbing pain. This pain may:  Happen in any area of the head, such as on one side or both  sides.  Interfere with daily activities.  Get worse with physical activity.  Get worse with exposure to bright lights or loud noises. Other symptoms may include:  Nausea.  Vomiting.  Dizziness.  General sensitivity to bright lights, loud noises, or smells. Before you get a migraine headache, you may get warning signs (an aura). An aura may include:  Seeing flashing lights or having blind spots.  Seeing bright spots, halos, or zigzag lines.  Having tunnel vision or blurred vision.  Having numbness or a tingling feeling.  Having trouble talking.  Having muscle weakness. Some people have symptoms after a migraine headache (postdromal phase), such as:  Feeling tired.  Difficulty  concentrating. How is this diagnosed? A migraine headache can be diagnosed based on:  Your symptoms.  A physical exam.  Tests, such as: ? CT scan or an MRI of the head. These imaging tests can help rule out other causes of headaches. ? Taking fluid from the spine (lumbar puncture) and analyzing it (cerebrospinal fluid analysis, or CSF analysis). How is this treated? This condition may be treated with medicines that:  Relieve pain.  Relieve nausea.  Prevent migraine headaches. Treatment for this condition may also include:  Acupuncture.  Lifestyle changes like avoiding foods that trigger migraine headaches.  Biofeedback.  Cognitive behavioral therapy. Follow these instructions at home: Medicines  Take over-the-counter and prescription medicines only as told by your health care provider.  Ask your health care provider if the medicine prescribed to you: ? Requires you to avoid driving or using heavy machinery. ? Can cause constipation. You may need to take these actions to prevent or treat constipation:  Drink enough fluid to keep your urine pale yellow.  Take over-the-counter or prescription medicines.  Eat foods that are high in fiber, such as beans, whole grains, and fresh fruits and vegetables.  Limit foods that are high in fat and processed sugars, such as fried or sweet foods. Lifestyle  Do not drink alcohol.  Do not use any products that contain nicotine or tobacco, such as cigarettes, e-cigarettes, and chewing tobacco. If you need help quitting, ask your health care provider.  Get at least 8 hours of sleep every night.  Find ways to manage stress, such as meditation, deep breathing, or yoga. General instructions      Keep a journal to find out what may trigger your migraine headaches. For example, write down: ? What you eat and drink. ? How much sleep you get. ? Any change to your diet or medicines.  If you have a migraine headache: ? Avoid things  that make your symptoms worse, such as bright lights. ? It may help to lie down in a dark, quiet room. ? Do not drive or use heavy machinery. ? Ask your health care provider what activities are safe for you while you are experiencing symptoms.  Keep all follow-up visits as told by your health care provider. This is important. Contact a health care provider if:  You develop symptoms that are different or more severe than your usual migraine headache symptoms.  You have more than 15 headache days in one month. Get help right away if:  Your migraine headache becomes severe.  Your migraine headache lasts longer than 72 hours.  You have a fever.  You have a stiff neck.  You have vision loss.  Your muscles feel weak or like you cannot control them.  You start to lose your balance often.  You have trouble walking.  You  faint.  You have a seizure. Summary  A migraine headache is an intense, throbbing pain on one side or both sides of the head. Migraines may also cause other symptoms, such as nausea, vomiting, and sensitivity to light and noise.  This condition may be treated with medicines and lifestyle changes. You may also need to avoid certain things that trigger a migraine headache.  Keep a journal to find out what may trigger your migraine headaches.  Contact your health care provider if you have more than 15 headache days in a month or you develop symptoms that are different or more severe than your usual migraine headache symptoms. This information is not intended to replace advice given to you by your health care provider. Make sure you discuss any questions you have with your health care provider. Document Revised: 10/06/2018 Document Reviewed: 07/27/2018 Elsevier Patient Education  [place].

## 2020-06-02 NOTE — Progress Notes (Signed)
Subjective:     Randy Mendez is a 54 y.o. male and is here for a comprehensive physical exam. Patient states he is doing well overall since recovering from Campylobacter infection.  Working for himself and irrigation/lighting.  Patient endorses a firm object underneath the skin in the left upper flank/chest.  Patient unsure if he got a splinter underneath his skin.  Taking Thrive supplements and shakes daily.   Used Thrive supplements in the past when ran triathlons but stopped as it became expensive, with $150 per month.  Had a COVID booster vaccine yesterday.  Inquires about influenza vaccine.  Notes had a migraine a few wks ago after drinking less water.  Pt seen by Hand for trigger finger injections.  Notes improvement in fingers of L hand, still unable to make a complete fist with R hand.  Social History   Socioeconomic History  . Marital status: Married    Spouse name: Not on file  . Number of children: Not on file  . Years of education: Not on file  . Highest education level: Not on file  Occupational History  . Not on file  Tobacco Use  . Smoking status: Former Smoker    Types: Cigars  . Smokeless tobacco: Never Used  . Tobacco comment: during college years  Vaping Use  . Vaping Use: Never used  Substance and Sexual Activity  . Alcohol use: Yes    Comment: occasionally  . Drug use: No  . Sexual activity: Not on file  Other Topics Concern  . Not on file  Social History Narrative  . Not on file   Social Determinants of Health   Financial Resource Strain:   . Difficulty of Paying Living Expenses: Not on file  Food Insecurity:   . Worried About Charity fundraiser in the Last Year: Not on file  . Ran Out of Food in the Last Year: Not on file  Transportation Needs:   . Lack of Transportation (Medical): Not on file  . Lack of Transportation (Non-Medical): Not on file  Physical Activity:   . Days of Exercise per Week: Not on file  . Minutes of Exercise per  Session: Not on file  Stress:   . Feeling of Stress : Not on file  Social Connections:   . Frequency of Communication with Friends and Family: Not on file  . Frequency of Social Gatherings with Friends and Family: Not on file  . Attends Religious Services: Not on file  . Active Member of Clubs or Organizations: Not on file  . Attends Archivist Meetings: Not on file  . Marital Status: Not on file  Intimate Partner Violence:   . Fear of Current or Ex-Partner: Not on file  . Emotionally Abused: Not on file  . Physically Abused: Not on file  . Sexually Abused: Not on file   Health Maintenance  Topic Date Due  . Hepatitis C Screening  Never done  . COVID-19 Vaccine (1) Never done  . HIV Screening  Never done  . TETANUS/TDAP  Never done  . COLONOSCOPY  08/25/2027  . INFLUENZA VACCINE  Completed    The following portions of the patient's history were reviewed and updated as appropriate: allergies, current medications, past family history, past medical history, past social history, past surgical history and problem list.  Review of Systems Pertinent items noted in HPI and remainder of comprehensive ROS otherwise negative.   Objective:    BP 110/70 (BP Location: Left Arm,  Patient Position: Sitting, Cuff Size: Normal)   Pulse 67   Temp 98 F (36.7 C) (Oral)   Ht 5\' 9"  (1.753 m)   Wt 170 lb (77.1 kg)   SpO2 97%   BMI 25.10 kg/m  General appearance: alert, cooperative and no distress Head: Normocephalic, without obvious abnormality, atraumatic Eyes: conjunctivae/corneas clear. PERRL, EOM's intact. Fundi benign. Ears: normal TM's and external ear canals both ears Nose: Nares normal. Septum midline. Mucosa normal. No drainage or sinus tenderness. Throat: lips, mucosa, and tongue normal; teeth and gums normal Neck: no adenopathy, no carotid bruit, no JVD, supple, symmetrical, trachea midline and thyroid not enlarged, symmetric, no tenderness/mass/nodules Lungs: clear to  auscultation bilaterally Chest wall: TTP of L lateral chest/upper L flank.  A 8 mm slender, firm, mass noted fairly superficial. Heart: regular rate and rhythm, S1, S2 normal, no murmur, click, rub or gallop Abdomen: soft, non-tender; bowel sounds normal; no masses,  no organomegaly Extremities: extremities normal, atraumatic, no cyanosis or edema Pulses: 2+ and symmetric Skin: Skin color, texture, turgor normal. No rashes or lesions.  An 8 mm slender, firm, mass in lateral left upper chest/upper left flank, fairly superficial, mobile, with mild TTP.  No puncture wound or lesions noted around the area. Lymph nodes: Cervical, supraclavicular, and axillary nodes normal. Neurologic: Alert and oriented X 3, normal strength and tone. Normal symmetric reflexes. Normal coordination and gait    Assessment:    Healthy male exam with firm mobile mass underneath skin of L upper flank/chest.     Plan:     Anticipatory guidance given including wearing seatbelts, smoke detectors in the home, increasing physical activity, increasing p.o. intake of water and vegetables. -will obtain labs -Colonoscopy up-to-date done 08/24/2017 -Reviewed ingredients of Thrive supplements.  Patient advised not worth the cost and filled with an unknown (proprietary) amount of caffeine.  Caffeine noted from guarana, green tea, and black coffee bean in the pill supplement. -Given handout -Next CPE in 1 year See After Visit Summary for Counseling Recommendations    Nodule of chest wall  -Possible splinter versus calcification fairly superficial in left lateral upper chest/left flank - Plan: [place] CHEST SOFT TISSUE  Need for immunization against influenza  - Plan: Flu Vaccine QUAD 6+ mos PF IM (Fluarix Quad PF)  F/u prn  Grier Mitts, MD

## 2020-06-03 LAB — COMPLETE METABOLIC PANEL WITH GFR
AG Ratio: 2.1 (calc) (ref 1.0–2.5)
ALT: 17 U/L (ref 9–46)
AST: 16 U/L (ref 10–35)
Albumin: 4.9 g/dL (ref 3.6–5.1)
Alkaline phosphatase (APISO): 86 U/L (ref 35–144)
BUN: 18 mg/dL (ref 7–25)
CO2: 30 mmol/L (ref 20–32)
Calcium: 9.5 mg/dL (ref 8.6–10.3)
Chloride: 103 mmol/L (ref 98–110)
Creat: 1.13 mg/dL (ref 0.70–1.33)
GFR, Est African American: 85 mL/min/{1.73_m2} (ref >=60)
GFR, Est Non African American: 73 mL/min/{1.73_m2} (ref >=60)
Globulin: 2.3 g/dL (calc) (ref 1.9–3.7)
Glucose, Bld: 82 mg/dL (ref 65–99)
Potassium: 4.4 mmol/L (ref 3.5–5.3)
Sodium: 140 mmol/L (ref 135–146)
Total Bilirubin: 1.3 mg/dL — ABNORMAL HIGH (ref 0.2–1.2)
Total Protein: 7.2 g/dL (ref 6.1–8.1)

## 2020-06-03 LAB — CBC WITH DIFFERENTIAL/PLATELET
Absolute Monocytes: 410 cells/uL (ref 200–950)
Basophils Absolute: 52 cells/uL (ref 0–200)
Basophils Relative: 0.8 %
Eosinophils Absolute: 91 cells/uL (ref 15–500)
Eosinophils Relative: 1.4 %
HCT: 46.9 % (ref 38.5–50.0)
Hemoglobin: 16.3 g/dL (ref 13.2–17.1)
Lymphs Abs: 1346 cells/uL (ref 850–3900)
MCH: 32.7 pg (ref 27.0–33.0)
MCHC: 34.8 g/dL (ref 32.0–36.0)
MCV: 94.2 fL (ref 80.0–100.0)
MPV: 9.8 fL (ref 7.5–12.5)
Monocytes Relative: 6.3 %
Neutro Abs: 4602 cells/uL (ref 1500–7800)
Neutrophils Relative %: 70.8 %
Platelets: 192 10*3/uL (ref 140–400)
RBC: 4.98 10*6/uL (ref 4.20–5.80)
RDW: 12.9 % (ref 11.0–15.0)
Total Lymphocyte: 20.7 %
WBC: 6.5 10*3/uL (ref 3.8–10.8)

## 2020-06-03 LAB — LIPID PANEL
Cholesterol: 223 mg/dL — ABNORMAL HIGH (ref <200)
HDL: 43 mg/dL (ref >=40)
LDL Cholesterol (Calc): 145 mg/dL (calc) — ABNORMAL HIGH
Non-HDL Cholesterol (Calc): 180 mg/dL (calc) — ABNORMAL HIGH (ref <130)
Total CHOL/HDL Ratio: 5.2 (calc) — ABNORMAL HIGH (ref <5.0)
Triglycerides: 208 mg/dL — ABNORMAL HIGH (ref <150)

## 2020-06-20 ENCOUNTER — Other Ambulatory Visit: Payer: Self-pay

## 2020-06-20 ENCOUNTER — Encounter (HOSPITAL_COMMUNITY): Payer: Self-pay | Admitting: Emergency Medicine

## 2020-06-20 ENCOUNTER — Emergency Department (HOSPITAL_COMMUNITY)
Admission: EM | Admit: 2020-06-20 | Discharge: 2020-06-20 | Disposition: A | Discharge status: Home / Self Care | Payer: BLUE CROSS/BLUE SHIELD | Attending: Emergency Medicine | Admitting: Emergency Medicine

## 2020-06-20 DIAGNOSIS — Z23 Encounter for immunization: Secondary | ICD-10-CM | POA: Insufficient documentation

## 2020-06-20 DIAGNOSIS — W540XXA Bitten by dog, initial encounter: Secondary | ICD-10-CM | POA: Diagnosis not present

## 2020-06-20 DIAGNOSIS — Z87891 Personal history of nicotine dependence: Secondary | ICD-10-CM | POA: Insufficient documentation

## 2020-06-20 DIAGNOSIS — S01312A Laceration without foreign body of left ear, initial encounter: Secondary | ICD-10-CM | POA: Insufficient documentation

## 2020-06-20 DIAGNOSIS — S01352A Open bite of left ear, initial encounter: Secondary | ICD-10-CM | POA: Diagnosis present

## 2020-06-20 MED ORDER — LIDOCAINE HCL (PF) 1 % IJ SOLN
10.0000 mL | Freq: Once | INTRAMUSCULAR | Status: DC
  Filled 2020-06-20: qty 10

## 2020-06-20 MED ORDER — AMOXICILLIN-POT CLAVULANATE 875-125 MG PO TABS: 1.0000 | ORAL_TABLET | Freq: Two times a day (BID) | ORAL | 0 refills | Status: DC

## 2020-06-20 MED ORDER — OXYCODONE HCL 5 MG PO TABS
5.0000 mg | ORAL_TABLET | Freq: Once | ORAL | Status: AC
Start: 2020-06-20 — End: 2020-06-20
  Administered 2020-06-20: 19:00:00 5 mg via ORAL
  Filled 2020-06-20: qty 1

## 2020-06-20 MED ORDER — ACETAMINOPHEN 500 MG PO TABS
1000.0000 mg | ORAL_TABLET | Freq: Once | ORAL | Status: AC
  Administered 2020-06-20: 19:00:00 1000 mg via ORAL
  Filled 2020-06-20: qty 2

## 2020-06-20 MED ORDER — AMOXICILLIN-POT CLAVULANATE 875-125 MG PO TABS
1.0000 | ORAL_TABLET | Freq: Once | ORAL | Status: AC
  Administered 2020-06-20: 17:00:00 1 via ORAL
  Filled 2020-06-20: qty 1

## 2020-06-20 MED ORDER — AMOXICILLIN-POT CLAVULANATE 875-125 MG PO TABS: 1.0000 | ORAL_TABLET | Freq: Two times a day (BID) | ORAL | 0 refills | Status: AC

## 2020-06-20 MED ORDER — TETANUS-DIPHTH-ACELL PERTUSSIS 5-2.5-18.5 LF-MCG/0.5 IM SUSY
0.5000 mL | PREFILLED_SYRINGE | Freq: Once | INTRAMUSCULAR | Status: AC
  Administered 2020-06-20: 17:00:00 0.5 mL via INTRAMUSCULAR
  Filled 2020-06-20: qty 0.5

## 2020-06-20 MED ORDER — LIDOCAINE-EPINEPHRINE 1 %-1:100000 IJ SOLN: 10.0000 mL | Freq: Once | INTRAMUSCULAR | Status: DC

## 2020-06-20 NOTE — ED Triage Notes (Signed)
Pt arrives to ED with c/o dog bite to left side of face and ear. Bleeding controlled but part of pts ear has been bitten off. Pt NAD. Pt states dog is caught up on shots.

## 2020-06-20 NOTE — Discharge Instructions (Addendum)
Return for redness, drainage fever.  Take the antibiotics as prescribed.

## 2020-06-20 NOTE — ED Provider Notes (Signed)
[facility] EMERGENCY DEPARTMENT Provider Note   CSN: [id number] Arrival date & time: 06/20/20  1536     History Chief Complaint  Patient presents with  . Animal Bite    Randy Mendez is a 54 y.o. male.  54 year old male with a chief complaints of a dog bite to the left ear.  The patient stuck his arm out for the dog to smell and it jumped up and bit him on the ear.  Had partial avulsion to the ear.  Tetanus has not been updated.  He denies other area of injury.  Thinks he lost part of his ear.  No change in hearing.  The history is provided by the patient.  Animal Bite Contact animal:  Dog Location:  Face and head/neck Head/neck injury location:  L ear Facial injury location:  L cheek Time since incident:  2 hours Pain details:    Quality:  Aching   Severity:  Moderate   Timing:  Constant   Progression:  Unchanged Incident location:  Home Provoked: unprovoked   Notifications:  None Animal's rabies vaccination status:  Up to date Animal in possession: yes   Tetanus status:  Out of date Relieved by:  Nothing Worsened by:  Nothing Ineffective treatments:  None tried Associated symptoms: no fever and no rash        Past Medical History:  Diagnosis Date  . Allergy   . Diverticulosis   . Hyperlipidemia    borderline    Patient Active Problem List   Diagnosis Date Noted  . Seasonal allergies 07/11/2017  . Migraines 07/11/2017    Past Surgical History:  Procedure Laterality Date  . TOOTH EXTRACTION    . WISDOM TOOTH EXTRACTION     as a child       Family History  Problem Relation Age of Onset  . Esophageal cancer Maternal Grandfather   . Stomach cancer Paternal Grandmother   . Colon cancer Neg Hx   . Colon polyps Neg Hx   . Rectal cancer Neg Hx     Social History   Tobacco Use  . Smoking status: Former Smoker    Types: Cigars  . Smokeless tobacco: Never Used  . Tobacco comment: during college years  Vaping Use  .  Vaping Use: Never used  Substance Use Topics  . Alcohol use: Yes    Comment: occasionally  . Drug use: No    Home Medications Prior to Admission medications   Medication Sig Start Date End Date Taking? Authorizing Provider  acetaminophen (TYLENOL) 325 MG tablet Take 650 mg by mouth every 6 (six) hours as needed for moderate pain.   Yes [provider]  ibuprofen (ADVIL,MOTRIN) 200 MG tablet Take 200 mg by mouth every 6 (six) hours as needed for mild pain.   Yes [provider]  loratadine (CLARITIN) 10 MG tablet Take 10 mg by mouth daily as needed for allergies.   Yes [provider]  amoxicillin-clavulanate (AUGMENTIN) 875-125 MG tablet Take 1 tablet by mouth 2 (two) times daily. One po bid x 7 days 06/20/20   Deno Etienne, DO    Allergies    Patient has no known allergies.  Review of Systems   Review of Systems  Constitutional: Negative for chills and fever.  HENT: Negative for congestion and facial swelling.   Eyes: Negative for discharge and visual disturbance.  Respiratory: Negative for shortness of breath.   Cardiovascular: Negative for chest pain and palpitations.  Gastrointestinal: Negative  for abdominal pain, diarrhea and vomiting.  Musculoskeletal: Negative for arthralgias and myalgias.  Skin: Positive for wound. Negative for color change and rash.  Neurological: Negative for tremors, syncope and headaches.  Psychiatric/Behavioral: Negative for confusion and dysphoric mood.    Physical Exam Updated Vital Signs BP 126/88 (BP Location: Right Arm)   Pulse 60   Temp 98.1 F (36.7 C) (Oral)   Resp 18   Ht 5\' 9"  (1.753 m)   Wt 78 kg   SpO2 99%   BMI 25.40 kg/m   Physical Exam Vitals and nursing note reviewed.  Constitutional:      Appearance: He is well-developed and well-nourished.  HENT:     Head: Normocephalic and atraumatic.     Ears:     Comments: Likely partial avulsion of the helix about the mid ear.  Exposed  cartilage.  Stellate wound to the left cheek about 3 cm anterior of the ear. Eyes:     Extraocular Movements: EOM normal.     Pupils: Pupils are equal, round, and reactive to light.  Neck:     Vascular: No JVD.  Cardiovascular:     Rate and Rhythm: Normal rate and regular rhythm.     Heart sounds: No murmur heard. No friction rub. No gallop.   Pulmonary:     Effort: No respiratory distress.     Breath sounds: No wheezing.  Abdominal:     General: There is no distension.     Tenderness: There is no guarding or rebound.  Musculoskeletal:        General: Normal range of motion.     Cervical back: Normal range of motion and neck supple.  Skin:    Coloration: Skin is not pale.     Findings: No rash.  Neurological:     Mental Status: He is alert and oriented to person, place, and time.  Psychiatric:        Mood and Affect: Mood and affect normal.        Behavior: Behavior normal.         ED Results / Procedures / Treatments   Labs (all labs ordered are listed, but only abnormal results are displayed) Labs Reviewed - No data to display  EKG None  Radiology No results found.  Procedures Procedures (including critical care time)  Medications Ordered in ED Medications  lidocaine (PF) (XYLOCAINE) 1 % injection 10 mL (has no administration in time range)  acetaminophen (TYLENOL) tablet 1,000 mg (has no administration in time range)  oxyCODONE (Oxy IR/ROXICODONE) immediate release tablet 5 mg (has no administration in time range)  lidocaine-EPINEPHrine (XYLOCAINE W/EPI) 1 %-1:100000 (with pres) injection 10 mL (has no administration in time range)  Tdap (BOOSTRIX) injection 0.5 mL (0.5 mLs Intramuscular Given 06/20/20 1636)  amoxicillin-clavulanate (AUGMENTIN) 875-125 MG per tablet 1 tablet (1 tablet Oral Given 06/20/20 1637)    ED Course  I have reviewed the triage vital signs and the nursing notes.  Pertinent labs & imaging results that were available during my  care of the patient were reviewed by me and considered in my medical decision making (see chart for details).    MDM Rules/Calculators/A&P                          54 yo M with a chief complaints of left ear injury from a dog bite.  Patient has an avulsion to the external ear along the helix with exposed cartilage.  Will discuss with ENT.  Update tetanus.   Discussed with Dr. Conley Simmonds, will perform primary repair.   D/c home.   6:59 PM:  I have discussed the diagnosis/risks/treatment options with the patient and believe the pt to be eligible for discharge home to follow-up with PCP. We also discussed returning to the ED immediately if new or worsening sx occur. We discussed the sx which are most concerning (e.g., sudden worsening pain, fever, inability to tolerate by mouth) that necessitate immediate return. Medications administered to the patient during their visit and any new prescriptions provided to the patient are listed below.  Medications given during this visit Medications  lidocaine (PF) (XYLOCAINE) 1 % injection 10 mL (has no administration in time range)  acetaminophen (TYLENOL) tablet 1,000 mg (has no administration in time range)  oxyCODONE (Oxy IR/ROXICODONE) immediate release tablet 5 mg (has no administration in time range)  lidocaine-EPINEPHrine (XYLOCAINE W/EPI) 1 %-1:100000 (with pres) injection 10 mL (has no administration in time range)  Tdap (BOOSTRIX) injection 0.5 mL (0.5 mLs Intramuscular Given 06/20/20 1636)  amoxicillin-clavulanate (AUGMENTIN) 875-125 MG per tablet 1 tablet (1 tablet Oral Given 06/20/20 1637)     The patient appears reasonably screen and/or stabilized for discharge and I doubt any other medical condition or other [facility] requiring further screening, evaluation, or treatment in the ED at this time prior to discharge.    Final Clinical Impression(s) / ED Diagnoses Final diagnoses:  Dog bite, initial encounter  Ear lobe laceration, left, initial  encounter    Rx / DC Orders ED Discharge Orders         Ordered    amoxicillin-clavulanate (AUGMENTIN) 875-125 MG tablet  2 times daily        06/20/20 1849           Deno Etienne, DO 06/20/20 1859

## 2020-06-20 NOTE — ED Notes (Signed)
DC isntructions reviewed with the pt.  Pt verbalized understanding. Pt DC.

## 2020-06-21 NOTE — Consult Note (Signed)
Reason for Consult: Facial laceration Referring Physician: Dr. Veatrice Kells Gearald Stonebraker is an 54 y.o. male.  HPI: 54 year old male with a chief complaints of a dog bite to the left ear.  The patient stuck his arm out for the dog to smell and it jumped up and bit him on the ear.  Had partial avulsion to the ear.  He received a tetanus booster while in the ED. He denies any other injuries. Facial trauma consulted for management of the complex ear laceration.  Past Medical History:  Diagnosis Date   Allergy    Diverticulosis    Hyperlipidemia    borderline    Past Surgical History:  Procedure Laterality Date   TOOTH EXTRACTION     WISDOM TOOTH EXTRACTION     as a child    Family History  Problem Relation Age of Onset   Esophageal cancer Maternal Grandfather    Stomach cancer Paternal Grandmother    Colon cancer Neg Hx    Colon polyps Neg Hx    Rectal cancer Neg Hx     Social History:  reports that he has quit smoking. His smoking use included cigars. He has never used smokeless tobacco. He reports current alcohol use. He reports that he does not use drugs.  Allergies: No Known Allergies  Medications: I have reviewed the patient's current medications.  No results found for this or any previous visit (from the past 48 hour(s)).  No results found.  Review of Systems Blood pressure 124/82, pulse 63, temperature 98.2 F (36.8 C), temperature source Oral, resp. rate 16, height 5\' 9"  (1.753 m), weight 78 kg, SpO2 98 %. Physical Exam   Maxillofacial Exam: 1cm total length stellate laceration of the left cheek 4cm total laceration of the left helix, ~1cm of tissue avulsion and exposed cartilage EAC clear, denies tinitis  CN VII intact EOMI, VA/VF intact No other facial injuries appreciated    Assessment/Plan: 41ym M With complex laceration to the left ear requiring repair and local tissue advancement. Thoroughly discussed the likely cosmetic defect that will  be present after repair and risk for infection and perichondritis with exposed cartilage. Will evaluate after primary closure for need for further treatment/revision.   Procedure note: Skin prepped with alcohol swabs. ~4cc 1% Lidocaine with 1:100k epi injected as a field block to the left ear. Wound irrigated and debrided. Skin prepped with Betadine. Patient draped in a sterile fashion. Necrotic edges removed for the cheek laceration, and skin was reapproximated with 5-0 fast absorbing gut sutures. Iris scissor used to undermine the lobe of the left ear and wound edges. 5-0 Vicryl deep sutures to advance caudal tissue to reapproximate the helix and recreate the antihelical fold. Skin closed with 5-0 Prolene in an interrupted fashion.   Plan: Will follow up in 1 week for suture removal Xeroform dressing x 24 hours Bacitracin to wound BID for 3 days, then switch to Vaseline to keep wound moist Avoid excess sun exposure OK to shower, do not submerge head in water  [employer] 06/21/2020, 6:45 AM

## 2020-06-26 ENCOUNTER — Ambulatory Visit (INDEPENDENT_AMBULATORY_CARE_PROVIDER_SITE_OTHER): Payer: BLUE CROSS/BLUE SHIELD | Admitting: Family Medicine

## 2020-06-26 ENCOUNTER — Other Ambulatory Visit: Payer: Self-pay

## 2020-06-26 ENCOUNTER — Encounter: Payer: Self-pay | Admitting: Family Medicine

## 2020-06-26 VITALS — BP 108/72 | HR 71 | Temp 98.1°F | Wt 170.0 lb

## 2020-06-26 DIAGNOSIS — W540XXD Bitten by dog, subsequent encounter: Secondary | ICD-10-CM | POA: Diagnosis not present

## 2020-06-26 DIAGNOSIS — H9202 Otalgia, left ear: Secondary | ICD-10-CM | POA: Diagnosis not present

## 2020-06-26 DIAGNOSIS — S01352D Open bite of left ear, subsequent encounter: Secondary | ICD-10-CM

## 2020-06-26 DIAGNOSIS — S01312D Laceration without foreign body of left ear, subsequent encounter: Secondary | ICD-10-CM | POA: Diagnosis not present

## 2020-06-26 DIAGNOSIS — S01352A Open bite of left ear, initial encounter: Secondary | ICD-10-CM | POA: Insufficient documentation

## 2020-06-26 NOTE — Progress Notes (Signed)
Subjective:    Patient ID: Randy Mendez, male    DOB: 10-Jan-1966, 54 y.o.   MRN: [id number]  No chief complaint on file.   HPI Patient was seen today for ED follow-up.  Patient seen in ED on 06/20/2020 after a client's dog jumped up and bit his L ear.  Patient taken to ED by his client.  Pt received sutures by oral maxillofacial surgeon for partial avulsion of left ear with cartilage exposed and Tdap vaccine.  Patient d/c'd on Augmentin twice daily x7 days.  Notes some stomach discomfort while on antibiotic.  Taking a probiotic.  Denies erythema, purulent drainage, induration.  Patient notes discomfort/pain in left ear at area with sutures and inability to lay on his left side.  Patient has follow-up with Dr. Ross Marcus, DMD on 06/27/2020 for suture removal.    Past Medical History:  Diagnosis Date   Allergy    Diverticulosis    Hyperlipidemia    borderline    No Known Allergies  ROS General: Denies fever, chills, night sweats, changes in weight, changes in appetite  + left ear discomfort/pain HEENT: Denies headaches, ear pain, changes in vision, rhinorrhea, sore throat CV: Denies CP, palpitations, SOB, orthopnea Pulm: Denies SOB, cough, wheezing GI: Denies abdominal pain, nausea, vomiting, diarrhea, constipation GU: Denies dysuria, hematuria, frequency Msk: Denies muscle cramps, joint pains   Neuro: Denies weakness, numbness, tingling Skin: Denies rashes, bruising  + laceration left ear and face Psych: Denies depression, anxiety, hallucinations     Objective:    Blood pressure 108/72, pulse 71, temperature 98.1 F (36.7 C), temperature source Oral, weight 170 lb (77.1 kg), SpO2 98 %.   Gen. Pleasant, well-nourished, in no distress, normal affect HEENT: [place]/AT, face symmetric, conjunctiva clear, no scleral icterus, PERRLA, EOMI, nares patent without drainage Lungs: no accessory muscle use Cardiovascular: RRR, no m/r/g, no peripheral edema Musculoskeletal: No  deformities, no cyanosis or clubbing, normal tone Neuro:  A&Ox3, CN II-XII intact, normal gait Skin:  Warm, no lesions/ rash.  Mild left preauricular edema, left ear clean, without drainage, erythema, induration.  blue sutures in place.  Puncture wound left preauricular area clean, dry, healing.       Wt Readings from Last 3 Encounters:  06/26/20 170 lb (77.1 kg)  06/20/20 172 lb (78 kg)  06/02/20 170 lb (77.1 kg)    Lab Results  Component Value Date   WBC 6.5 06/02/2020   HGB 16.3 06/02/2020   HCT 46.9 06/02/2020   PLT 192 06/02/2020   GLUCOSE 82 06/02/2020   CHOL 223 (H) 06/02/2020   TRIG 208 (H) 06/02/2020   HDL 43 06/02/2020   LDLCALC 145 (H) 06/02/2020   ALT 17 06/02/2020   AST 16 06/02/2020   NA 140 06/02/2020   K 4.4 06/02/2020   CL 103 06/02/2020   CREATININE 1.13 06/02/2020   BUN 18 06/02/2020   CO2 30 06/02/2020   PSA 1.91 07/27/2017   HGBA1C 5.7 07/27/2017    Assessment/Plan:  Dog bite of left ear, subsequent encounter  Laceration of left earlobe, subsequent encounter  Left ear pain  -Continue supportive care including keeping area clean, dry, and applying Vaseline -Tdap up-to-date as given on 06/20/2020 during ED visit -Okay to use NSAIDs as needed for any pain/discomfort -Patient encouraged to keep follow-up appointment tomorrow with Dr. Ross Marcus, DMD for suture removal -Given precautions  F/u as needed  Abbe Amsterdam, MD

## 2020-11-18 ENCOUNTER — Other Ambulatory Visit: Payer: Self-pay

## 2020-11-18 ENCOUNTER — Ambulatory Visit (INDEPENDENT_AMBULATORY_CARE_PROVIDER_SITE_OTHER): Payer: 59

## 2020-11-18 ENCOUNTER — Ambulatory Visit (INDEPENDENT_AMBULATORY_CARE_PROVIDER_SITE_OTHER): Payer: 59 | Admitting: Orthopaedic Surgery

## 2020-11-18 DIAGNOSIS — M25572 Pain in left ankle and joints of left foot: Secondary | ICD-10-CM

## 2020-11-18 NOTE — Progress Notes (Signed)
Office Visit Note   Patient: Randy Mendez           Date of Birth: 03/03/1966           MRN: [id number] Visit Date: 11/18/2020              Requested by: Billie Ruddy, MD [place],  [place] [id number] PCP: Billie Ruddy, MD   Assessment & Plan: Visit Diagnoses:  1. Pain in left ankle and joints of left foot     Plan: Based on findings I suspect that he may have partially torn the insertion on the lateral side of the Achilles but fortunately the function is all still intact.  I recommend continuing with the ankle brace and giving this a little bit more time.  He will use Voltaren gel and over-the-counter medications.  I also provided him with exercises to use at home once he starts to feel some relief.  He should follow-up with [place] in about a month if he does not feel any continued improvement.  Follow-Up Instructions: Return if symptoms worsen or fail to improve.   Orders:  Orders Placed This Encounter  Procedures  . XR Ankle Complete Left   No orders of the defined types were placed in this encounter.     Procedures: No procedures performed   Clinical Data: No additional findings.   Subjective: Chief Complaint  Patient presents with  . Left Ankle - Pain  . Left Heel - Pain    Randy Mendez is a 55 year old gentleman who comes in for evaluation of acute left heel injury that happened about 8 days ago.  He had a misstep off a bottom step and he landed directly on the left heel.  He is able to stand on his toes.  Has been walking with a walking stick.  He notices slight swelling.  He tried a walking boot which made the pain worse and an ankle stabilizing brace has helped.  Denies any numbness and tingling.   Review of Systems  Constitutional: Negative.   All other systems reviewed and are negative.    Objective: Vital Signs: There were no vitals taken for this visit.  Physical Exam Vitals and nursing note reviewed.  Constitutional:       Appearance: He is well-developed.  HENT:     Head: Normocephalic and atraumatic.  Eyes:     Pupils: Pupils are equal, round, and reactive to light.  Pulmonary:     Effort: Pulmonary effort is normal.  Abdominal:     Palpations: Abdomen is soft.  Musculoskeletal:        General: Normal range of motion.     Cervical back: Neck supple.  Skin:    General: Skin is warm.  Neurological:     Mental Status: He is alert and oriented to person, place, and time.  Psychiatric:        Behavior: Behavior normal.        Thought Content: Thought content normal.        Judgment: Judgment normal.     Ortho Exam Left heel shows a slight tenderness to the posterior lateral region of the calcaneus.  Slight swelling in this region.  The peroneal tendons are asymptomatic without any subluxation.  Posterior tibial tendon is unremarkable.  Plantar fascia is unremarkable.  He has good plantarflexion strength and a normal Thompson's test.  There is no palpable defect of the Achilles tendon. Specialty Comments:  No specialty comments available.  Imaging: XR Ankle Complete Left  Result Date: 11/18/2020 No acute or structural abnormalities    PMFS History: Patient Active Problem List   Diagnosis Date Noted  . Dog bite of left ear 06/26/2020  . Seasonal allergies 07/11/2017  . Migraines 07/11/2017   Past Medical History:  Diagnosis Date  . Allergy   . Diverticulosis   . Hyperlipidemia    borderline    Family History  Problem Relation Age of Onset  . Esophageal cancer Maternal Grandfather   . Stomach cancer Paternal Grandmother   . Colon cancer Neg Hx   . Colon polyps Neg Hx   . Rectal cancer Neg Hx     Past Surgical History:  Procedure Laterality Date  . TOOTH EXTRACTION    . WISDOM TOOTH EXTRACTION     as a child   Social History   Occupational History  . Not on file  Tobacco Use  . Smoking status: Former Smoker    Types: Cigars  . Smokeless tobacco: Never Used  . Tobacco  comment: during college years  Vaping Use  . Vaping Use: Never used  Substance and Sexual Activity  . Alcohol use: Yes    Comment: occasionally  . Drug use: No  . Sexual activity: Not on file

## 2021-01-19 ENCOUNTER — Encounter: Payer: Self-pay | Admitting: Family Medicine

## 2021-01-19 ENCOUNTER — Telehealth: Payer: Self-pay | Admitting: Family Medicine

## 2021-01-19 NOTE — Telephone Encounter (Signed)
Spoke with patient, received form. Patient called back with fax number, forme completed and faxed.

## 2021-01-19 NOTE — Telephone Encounter (Signed)
Pt call and want a call back about a form that dr.Banks fill out for him.

## 2022-10-06 DIAGNOSIS — D1801 Hemangioma of skin and subcutaneous tissue: Secondary | ICD-10-CM | POA: Diagnosis not present

## 2022-10-06 DIAGNOSIS — D225 Melanocytic nevi of trunk: Secondary | ICD-10-CM | POA: Diagnosis not present

## 2022-10-06 DIAGNOSIS — L814 Other melanin hyperpigmentation: Secondary | ICD-10-CM | POA: Diagnosis not present

## 2022-10-06 DIAGNOSIS — L821 Other seborrheic keratosis: Secondary | ICD-10-CM | POA: Diagnosis not present

## 2022-10-06 DIAGNOSIS — L570 Actinic keratosis: Secondary | ICD-10-CM | POA: Diagnosis not present

## 2023-02-07 ENCOUNTER — Ambulatory Visit (INDEPENDENT_AMBULATORY_CARE_PROVIDER_SITE_OTHER): Payer: 59 | Admitting: Family Medicine

## 2023-02-07 ENCOUNTER — Encounter: Payer: Self-pay | Admitting: Family Medicine

## 2023-02-07 VITALS — BP 130/86 | HR 93 | Temp 98.5°F | Ht 69.0 in | Wt 169.0 lb

## 2023-02-07 DIAGNOSIS — Z13220 Encounter for screening for lipoid disorders: Secondary | ICD-10-CM

## 2023-02-07 DIAGNOSIS — R0989 Other specified symptoms and signs involving the circulatory and respiratory systems: Secondary | ICD-10-CM | POA: Diagnosis not present

## 2023-02-07 DIAGNOSIS — Z23 Encounter for immunization: Secondary | ICD-10-CM | POA: Diagnosis not present

## 2023-02-07 DIAGNOSIS — Z125 Encounter for screening for malignant neoplasm of prostate: Secondary | ICD-10-CM | POA: Diagnosis not present

## 2023-02-07 DIAGNOSIS — Z0000 Encounter for general adult medical examination without abnormal findings: Secondary | ICD-10-CM | POA: Diagnosis not present

## 2023-02-07 DIAGNOSIS — H9313 Tinnitus, bilateral: Secondary | ICD-10-CM

## 2023-02-07 DIAGNOSIS — Z0001 Encounter for general adult medical examination with abnormal findings: Secondary | ICD-10-CM

## 2023-02-07 DIAGNOSIS — M65332 Trigger finger, left middle finger: Secondary | ICD-10-CM | POA: Diagnosis not present

## 2023-02-07 DIAGNOSIS — M65331 Trigger finger, right middle finger: Secondary | ICD-10-CM | POA: Diagnosis not present

## 2023-02-07 LAB — CBC WITH DIFFERENTIAL/PLATELET
Basophils Absolute: 0 10*3/uL (ref 0.0–0.1)
Basophils Relative: 0.8 % (ref 0.0–3.0)
Eosinophils Absolute: 0.1 10*3/uL (ref 0.0–0.7)
Eosinophils Relative: 1.1 % (ref 0.0–5.0)
HCT: 45.4 % (ref 39.0–52.0)
Hemoglobin: 15 g/dL (ref 13.0–17.0)
Lymphocytes Relative: 23.6 % (ref 12.0–46.0)
Lymphs Abs: 1.5 10*3/uL (ref 0.7–4.0)
MCHC: 33 g/dL (ref 30.0–36.0)
MCV: 94.8 fl (ref 78.0–100.0)
Monocytes Absolute: 0.3 10*3/uL (ref 0.1–1.0)
Monocytes Relative: 4.8 % (ref 3.0–12.0)
Neutro Abs: 4.5 10*3/uL (ref 1.4–7.7)
Neutrophils Relative %: 69.7 % (ref 43.0–77.0)
Platelets: 191 10*3/uL (ref 150.0–400.0)
RBC: 4.79 Mil/uL (ref 4.22–5.81)
RDW: 14 % (ref 11.5–15.5)
WBC: 6.4 10*3/uL (ref 4.0–10.5)

## 2023-02-07 LAB — COMPREHENSIVE METABOLIC PANEL
ALT: 25 U/L (ref 0–53)
AST: 24 U/L (ref 0–37)
Albumin: 4.5 g/dL (ref 3.5–5.2)
Alkaline Phosphatase: 80 U/L (ref 39–117)
BUN: 15 mg/dL (ref 6–23)
CO2: 27 mEq/L (ref 19–32)
Calcium: 9.2 mg/dL (ref 8.4–10.5)
Chloride: 103 mEq/L (ref 96–112)
Creatinine, Ser: 1.06 mg/dL (ref 0.40–1.50)
GFR: 78.33 mL/min (ref >60.00)
Glucose, Bld: 100 mg/dL — ABNORMAL HIGH (ref 70–99)
Potassium: 4.1 mEq/L (ref 3.5–5.1)
Sodium: 137 mEq/L (ref 135–145)
Total Bilirubin: 1.1 mg/dL (ref 0.2–1.2)
Total Protein: 6.6 g/dL (ref 6.0–8.3)

## 2023-02-07 LAB — LIPID PANEL
Cholesterol: 237 mg/dL — ABNORMAL HIGH (ref 0–200)
HDL: 50.9 mg/dL (ref >39.00)
LDL Cholesterol: 166 mg/dL — ABNORMAL HIGH (ref 0–99)
NonHDL: 185.71
Total CHOL/HDL Ratio: 5
Triglycerides: 100 mg/dL (ref 0.0–149.0)
VLDL: 20 mg/dL (ref 0.0–40.0)

## 2023-02-07 LAB — T4, FREE: Free T4: 0.99 ng/dL (ref 0.60–1.60)

## 2023-02-07 LAB — PSA: PSA: 1.21 ng/mL (ref 0.10–4.00)

## 2023-02-07 LAB — TSH: TSH: 1.57 u[IU]/mL (ref 0.35–5.50)

## 2023-02-07 LAB — HEMOGLOBIN A1C: Hgb A1c MFr Bld: 5.9 % (ref 4.6–6.5)

## 2023-02-07 NOTE — Progress Notes (Signed)
Established Patient Office Visit   Subjective  Patient ID: Randy Mendez, male    DOB: Jun 11, 1966  Age: 57 y.o. MRN: [id number]  Chief Complaint  Patient presents with   Annual Exam    Patient is a 57 year old male lost to follow-up, who is seen for CPE.  Patient states has been doing well overall.  Wants to stay on top of his health due to several friends recently being diagnosed with various issues.  Pt with b/l tinnitus.  Does not keep him up at night.  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  Patient notes history of trigger finger in bilateral hands status post steroid injections which worked well.  Now triggering recurring in the bilateral middle fingers.  Fingers painful when they lock.      Past Medical History:  Diagnosis Date   Allergy    Diverticulosis    Hyperlipidemia    borderline   Past Surgical History:  Procedure Laterality Date   TOOTH EXTRACTION     WISDOM TOOTH EXTRACTION     as a child   Social History   Tobacco Use   Smoking status: Former    Types: Cigars   Smokeless tobacco: Never   Tobacco comments:    during college years  Vaping Use   Vaping status: Never Used  Substance Use Topics   Alcohol use: Yes    Comment: occasionally   Drug use: No   Family History  Problem Relation Age of Onset   Esophageal cancer Maternal Grandfather    Stomach cancer Paternal Grandmother    Colon cancer Neg Hx    Colon polyps Neg Hx    Rectal cancer Neg Hx    No Known Allergies    ROS Negative unless stated above    Objective:     BP 130/86 (BP Location: Left Arm, Patient Position: Sitting, Cuff Size: Normal)   Pulse 93   Temp 98.5 F (36.9 C) (Oral)   Ht 5\' 9"  (1.753 m)   Wt 169 lb (76.7 kg)   SpO2 97%   BMI 24.96 kg/m    Physical Exam Constitutional:      Appearance: Normal appearance.  HENT:     Head: Normocephalic and atraumatic.     Right Ear: Tympanic  membrane, ear canal and external ear normal.     Left Ear: Tympanic membrane, ear canal and external ear normal.     Nose: Nose normal.     Mouth/Throat:     Mouth: Mucous membranes are moist.     Pharynx: No oropharyngeal exudate or posterior oropharyngeal erythema.  Eyes:     General: No scleral icterus.    Extraocular Movements: Extraocular movements intact.     Conjunctiva/sclera: Conjunctivae normal.     Pupils: Pupils are equal, round, and reactive to light.  Neck:     Thyroid: No thyromegaly.     Vascular: Carotid bruit present.  Cardiovascular:     Rate and Rhythm: Normal rate and regular rhythm.     Pulses: Normal pulses.     Heart sounds: Normal heart sounds. No murmur heard.    No friction rub.  Pulmonary:     Effort: Pulmonary effort is normal.     Breath sounds: Normal breath sounds. No wheezing, rhonchi or rales.  Abdominal:     General: Bowel sounds are normal.     Palpations: Abdomen is soft.  Tenderness: There is no abdominal tenderness.  Musculoskeletal:        General: No deformity. Normal range of motion.     Comments: Triggering of bilateral third digits of hands.  No erythema or overt edema noted.  Lymphadenopathy:     Cervical: No cervical adenopathy.  Skin:    General: Skin is warm and dry.     Findings: No lesion.  Neurological:     General: No focal deficit present.     Mental Status: He is alert and oriented to person, place, and time.  Psychiatric:        Mood and Affect: Mood normal.        Thought Content: Thought content normal.      No results found for any visits on 02/07/23.    Assessment & Plan:  Encounter for routine adult health examination with abnormal findings -Age-appropriate health screenings discussed. -Will obtain labs -Immunizations reviewed.  Patient received first dose of shingles vaccine this visit.  Consider COVID and influenza vaccine when available later this year -Colonoscopy up-to-date done 08/17/2018 -Next  CPE in 1 year -     Hemoglobin A1c  Tinnitus of both ears -discussed possible causes -discussed treatment options including sounds to drown out the noise -     Comprehensive metabolic panel -     CBC with Differential/Platelet; Future -     Lipid panel  Trigger middle finger of left hand -     Ambulatory referral to Hand Surgery -     CBC with Differential/Platelet; Future  Trigger middle finger of right hand -     Ambulatory referral to Hand Surgery -     CBC with Differential/Platelet; Future  Bilateral carotid bruits -heard on exam -     US Carotid Bilateral; Future -     Comprehensive metabolic panel -     TSH -     T4, free -     Hemoglobin A1c -     Lipid panel  Screening for prostate cancer -     PSA  Need for shingles vaccine -     Varicella-zoster vaccine IM   Return in about 6 months (around 08/10/2023).   Deeann Saint, MD

## 2023-02-07 NOTE — Patient Instructions (Signed)
A referral was placed for you to see the hand surgeon as well as have an ultrasound of your carotid arteries to further evaluate for any blockages.  You should expect a phone call about scheduling these appointments.  Labs were also placed for you.

## 2023-02-11 ENCOUNTER — Ambulatory Visit
Admission: RE | Admit: 2023-02-11 | Discharge: 2023-02-11 | Disposition: A | Discharge status: Home / Self Care | Payer: 59 | Source: Ambulatory Visit | Attending: Family Medicine | Admitting: Family Medicine

## 2023-02-11 DIAGNOSIS — R0989 Other specified symptoms and signs involving the circulatory and respiratory systems: Secondary | ICD-10-CM | POA: Diagnosis not present

## 2023-06-13 ENCOUNTER — Encounter: Payer: Self-pay | Admitting: Family Medicine

## 2023-06-13 ENCOUNTER — Ambulatory Visit (INDEPENDENT_AMBULATORY_CARE_PROVIDER_SITE_OTHER): Payer: 59 | Admitting: Family Medicine

## 2023-06-13 VITALS — BP 110/68 | HR 63 | Temp 98.6°F | Ht 69.0 in | Wt 168.6 lb

## 2023-06-13 DIAGNOSIS — K436 Other and unspecified ventral hernia with obstruction, without gangrene: Secondary | ICD-10-CM

## 2023-06-13 NOTE — Progress Notes (Signed)
Established Patient Office Visit   Subjective  Patient ID: Randy Mendez, male    DOB: 1965-09-11  Age: 57 y.o. MRN: [id number]  Chief Complaint  Patient presents with   Abdominal Pain    Started Friday, rate of pain 4 out of 10     Patient is a 57 year old male seen for acute concern.  Patient endorses abdominal pain starting Friday after working.  Patient has a Physiological scientist.  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  Doing mostly lateral movements.  Pain is a constant 4/10.  Denies heavy lifting, emesis, increased pain/size of bulge with defecation.  Abdominal Pain    Patient Active Problem List   Diagnosis Date Noted   Dog bite of left ear 06/26/2020   Seasonal allergies 07/11/2017   Migraines 07/11/2017   Past Medical History:  Diagnosis Date   Allergy    Diverticulosis    Hyperlipidemia    borderline   Past Surgical History:  Procedure Laterality Date   TOOTH EXTRACTION     WISDOM TOOTH EXTRACTION     as a child   Social History   Tobacco Use   Smoking status: Former    Types: Cigars   Smokeless tobacco: Never   Tobacco comments:    during college years  Vaping Use   Vaping status: Never Used  Substance Use Topics   Alcohol use: Yes    Comment: occasionally   Drug use: No   Family Status  Relation Name Status   MGF  (Not Specified)   PGM  (Not Specified)   Neg Hx  (Not Specified)  No partnership data on file   Family History  Problem Relation Age of Onset   Esophageal cancer Maternal Grandfather    Stomach cancer Paternal Grandmother    Colon cancer Neg Hx    Colon polyps Neg Hx    Rectal cancer Neg Hx    No Known Allergies    Review of Systems  Gastrointestinal:  Positive for abdominal pain.   Negative unless stated above    Objective:     BP 110/68 (BP Location: Left Arm, Patient Position: Sitting, Cuff Size: Large)   Pulse 63   Temp 98.6  F (37 C) (Oral)   Ht 5\' 9"  (1.753 m)   Wt 168 lb 9.6 oz (76.5 kg)   SpO2 95%   BMI 24.90 kg/m  BP Readings from Last 3 Encounters:  06/13/23 110/68  02/07/23 130/86  06/26/20 108/72   Wt Readings from Last 3 Encounters:  06/13/23 168 lb 9.6 oz (76.5 kg)  02/07/23 169 lb (76.7 kg)  06/26/20 170 lb (77.1 kg)      Physical Exam Constitutional:      Appearance: He is well-developed.  HENT:     Head: Normocephalic and atraumatic.  Cardiovascular:     Rate and Rhythm: Normal rate.  Pulmonary:     Effort: Pulmonary effort is normal.  Abdominal:     General: Bowel sounds are increased.     Palpations: Abdomen is soft.     Tenderness: There is abdominal tenderness in the epigastric area.     Hernia: A hernia is present.       Comments: Normal appearing abdomen.  A 1 cm firm round area prominent on deep palpation midline abdomen midway between umbilicus and epigastric area with pt supine.  Minimally reducible at best.  Area slightly larger in size with  pt sitting up.  Skin:    General: Skin is warm and dry.  Neurological:     Mental Status: He is alert and oriented to person, place, and time.      No results found for any visits on 06/13/23.    Assessment & Plan:  Incarcerated epigastric hernia -     CT ABDOMEN W CONTRAST; Future -     Ambulatory referral to General Surgery   Acute bulge and abdominal pain concerning for hernia.  Unable to reduce area on exam.  Obtain CT abdomen.  Referral placed to general surgery for likely repair.  Patient advised to avoid heavy lifting.  Given strict precautions for change in symptoms.  Return if symptoms worsen or fail to improve.   Deeann Saint, MD

## 2023-06-14 ENCOUNTER — Ambulatory Visit (HOSPITAL_COMMUNITY)
Admission: RE | Admit: 2023-06-14 | Discharge: 2023-06-14 | Disposition: A | Discharge status: Home / Self Care | Payer: 59 | Source: Ambulatory Visit | Attending: Family Medicine | Admitting: Family Medicine

## 2023-06-14 DIAGNOSIS — K436 Other and unspecified ventral hernia with obstruction, without gangrene: Secondary | ICD-10-CM | POA: Diagnosis not present

## 2023-06-14 DIAGNOSIS — N281 Cyst of kidney, acquired: Secondary | ICD-10-CM | POA: Diagnosis not present

## 2023-06-14 DIAGNOSIS — R1013 Epigastric pain: Secondary | ICD-10-CM | POA: Diagnosis not present

## 2023-06-14 MED ORDER — IOHEXOL 350 MG/ML SOLN
75.0000 mL | Freq: Once | INTRAVENOUS | Status: AC | PRN
  Administered 2023-06-14: 75 mL via INTRAVENOUS

## 2023-06-14 NOTE — Telephone Encounter (Signed)
Pt is schedule for [facility] at 06-14-2023 at 2 pm and lakirah will let pt know

## 2023-06-14 NOTE — Telephone Encounter (Signed)
Pt is aware of the appt?

## 2023-08-04 DIAGNOSIS — K439 Ventral hernia without obstruction or gangrene: Secondary | ICD-10-CM | POA: Diagnosis not present

## 2023-08-05 DIAGNOSIS — R059 Cough, unspecified: Secondary | ICD-10-CM | POA: Diagnosis not present

## 2023-08-05 DIAGNOSIS — B9689 Other specified bacterial agents as the cause of diseases classified elsewhere: Secondary | ICD-10-CM | POA: Diagnosis not present

## 2023-08-05 DIAGNOSIS — J208 Acute bronchitis due to other specified organisms: Secondary | ICD-10-CM | POA: Diagnosis not present

## 2023-10-03 DIAGNOSIS — Z23 Encounter for immunization: Secondary | ICD-10-CM | POA: Diagnosis not present

## 2023-10-06 DIAGNOSIS — L578 Other skin changes due to chronic exposure to nonionizing radiation: Secondary | ICD-10-CM | POA: Diagnosis not present

## 2023-10-06 DIAGNOSIS — L814 Other melanin hyperpigmentation: Secondary | ICD-10-CM | POA: Diagnosis not present

## 2023-10-06 DIAGNOSIS — L570 Actinic keratosis: Secondary | ICD-10-CM | POA: Diagnosis not present

## 2023-10-06 DIAGNOSIS — D1801 Hemangioma of skin and subcutaneous tissue: Secondary | ICD-10-CM | POA: Diagnosis not present

## 2023-11-07 DIAGNOSIS — F411 Generalized anxiety disorder: Secondary | ICD-10-CM | POA: Diagnosis not present

## 2023-11-22 DIAGNOSIS — F331 Major depressive disorder, recurrent, moderate: Secondary | ICD-10-CM | POA: Diagnosis not present

## 2024-06-07 DIAGNOSIS — M65331 Trigger finger, right middle finger: Secondary | ICD-10-CM | POA: Diagnosis not present

## 2024-06-07 DIAGNOSIS — M65332 Trigger finger, left middle finger: Secondary | ICD-10-CM | POA: Diagnosis not present
